# Patient Record
Sex: Male | Born: 1980 | Race: White | Hispanic: No | Marital: Single | Smoking: Current every day smoker
Health system: Southern US, Community
[De-identification: ages and names within clinical notes are randomized; demographics above are authoritative.]

## PROBLEM LIST (undated history)

## (undated) DIAGNOSIS — K029 Dental caries, unspecified: Secondary | ICD-10-CM

## (undated) DIAGNOSIS — N200 Calculus of kidney: Secondary | ICD-10-CM

---

## 2005-04-10 ENCOUNTER — Emergency Department (HOSPITAL_COMMUNITY): Admission: EM | Admit: 2005-04-10 | Discharge: 2005-04-10 | Payer: Self-pay | Admitting: Emergency Medicine

## 2005-04-24 ENCOUNTER — Emergency Department (HOSPITAL_COMMUNITY): Admission: EM | Admit: 2005-04-24 | Discharge: 2005-04-24 | Payer: Self-pay | Admitting: Emergency Medicine

## 2005-05-03 ENCOUNTER — Emergency Department: Payer: Self-pay | Admitting: Emergency Medicine

## 2005-05-04 ENCOUNTER — Emergency Department (HOSPITAL_COMMUNITY): Admission: EM | Admit: 2005-05-04 | Discharge: 2005-05-05 | Payer: Self-pay | Admitting: Emergency Medicine

## 2005-12-24 ENCOUNTER — Emergency Department (HOSPITAL_COMMUNITY): Admission: EM | Admit: 2005-12-24 | Discharge: 2005-12-24 | Payer: Self-pay | Admitting: Emergency Medicine

## 2006-06-29 ENCOUNTER — Emergency Department (HOSPITAL_COMMUNITY): Admission: EM | Admit: 2006-06-29 | Discharge: 2006-06-29 | Payer: Self-pay | Admitting: Emergency Medicine

## 2009-12-22 ENCOUNTER — Emergency Department (HOSPITAL_COMMUNITY): Admission: EM | Admit: 2009-12-22 | Discharge: 2009-12-22 | Payer: Self-pay | Admitting: Emergency Medicine

## 2010-02-07 ENCOUNTER — Emergency Department (HOSPITAL_COMMUNITY): Admission: EM | Admit: 2010-02-07 | Discharge: 2010-02-08 | Payer: Self-pay | Admitting: Emergency Medicine

## 2010-02-18 ENCOUNTER — Emergency Department (HOSPITAL_COMMUNITY): Admission: EM | Admit: 2010-02-18 | Discharge: 2010-02-18 | Payer: Self-pay | Admitting: Emergency Medicine

## 2010-10-29 LAB — RAPID URINE DRUG SCREEN, HOSP PERFORMED
Amphetamines: NOT DETECTED
Barbiturates: NOT DETECTED
Benzodiazepines: POSITIVE — AB
Cocaine: POSITIVE — AB
Opiates: NOT DETECTED
Tetrahydrocannabinol: POSITIVE — AB

## 2010-10-29 LAB — CBC
HCT: 44.3 % (ref 39.0–52.0)
Hemoglobin: 15.4 g/dL (ref 13.0–17.0)
MCH: 33.4 pg (ref 26.0–34.0)
MCHC: 34.7 g/dL (ref 30.0–36.0)
MCV: 96.1 fL (ref 78.0–100.0)
Platelets: 163 10*3/uL (ref 150–400)
RBC: 4.61 MIL/uL (ref 4.22–5.81)
RDW: 13.1 % (ref 11.5–15.5)
WBC: 5.4 10*3/uL (ref 4.0–10.5)

## 2010-10-29 LAB — BASIC METABOLIC PANEL
BUN: 8 mg/dL (ref 6–23)
CO2: 26 mEq/L (ref 19–32)
Calcium: 9 mg/dL (ref 8.4–10.5)
Chloride: 103 mEq/L (ref 96–112)
Creatinine, Ser: 0.79 mg/dL (ref 0.4–1.5)
GFR calc Af Amer: >60 mL/min (ref >60)
GFR calc non Af Amer: >60 mL/min (ref >60)
Glucose, Bld: 74 mg/dL (ref 70–99)
Potassium: 3.8 mEq/L (ref 3.5–5.1)
Sodium: 140 mEq/L (ref 135–145)

## 2010-10-29 LAB — URINALYSIS, ROUTINE W REFLEX MICROSCOPIC
Bilirubin Urine: NEGATIVE
Glucose, UA: NEGATIVE mg/dL
Ketones, ur: NEGATIVE mg/dL
Leukocytes, UA: NEGATIVE
Nitrite: NEGATIVE
Protein, ur: NEGATIVE mg/dL
Specific Gravity, Urine: <1.005 — ABNORMAL LOW (ref 1.005–1.030)
Urobilinogen, UA: 0.2 mg/dL (ref 0.0–1.0)
pH: 6 (ref 5.0–8.0)

## 2010-10-29 LAB — SALICYLATE LEVEL: Salicylate Lvl: <4 mg/dL (ref 2.8–20.0)

## 2010-10-29 LAB — DIFFERENTIAL
Basophils Absolute: 0 10*3/uL (ref 0.0–0.1)
Basophils Relative: 1 % (ref 0–1)
Eosinophils Absolute: 0.1 10*3/uL (ref 0.0–0.7)
Eosinophils Relative: 1 % (ref 0–5)
Lymphocytes Relative: 42 % (ref 12–46)
Lymphs Abs: 2.3 10*3/uL (ref 0.7–4.0)
Monocytes Absolute: 0.4 10*3/uL (ref 0.1–1.0)
Monocytes Relative: 7 % (ref 3–12)
Neutro Abs: 2.6 10*3/uL (ref 1.7–7.7)
Neutrophils Relative %: 49 % (ref 43–77)

## 2010-10-29 LAB — URINE MICROSCOPIC-ADD ON

## 2010-10-29 LAB — CK: Total CK: 149 U/L (ref 7–232)

## 2010-10-29 LAB — ACETAMINOPHEN LEVEL: Acetaminophen (Tylenol), Serum: <10 ug/mL — ABNORMAL LOW (ref 10–30)

## 2010-10-29 LAB — ETHANOL: Alcohol, Ethyl (B): 186 mg/dL — ABNORMAL HIGH (ref 0–10)

## 2011-03-24 ENCOUNTER — Emergency Department (HOSPITAL_COMMUNITY)
Admission: EM | Admit: 2011-03-24 | Discharge: 2011-03-24 | Disposition: A | Discharge status: Home / Self Care | Payer: Self-pay | Attending: Emergency Medicine | Admitting: Emergency Medicine

## 2011-03-24 DIAGNOSIS — K0510 Chronic gingivitis, plaque induced: Secondary | ICD-10-CM

## 2011-03-24 DIAGNOSIS — K0889 Other specified disorders of teeth and supporting structures: Secondary | ICD-10-CM

## 2011-03-24 DIAGNOSIS — K089 Disorder of teeth and supporting structures, unspecified: Secondary | ICD-10-CM | POA: Insufficient documentation

## 2011-03-24 HISTORY — DX: Calculus of kidney: N20.0

## 2011-03-24 MED ORDER — HYDROCODONE-ACETAMINOPHEN 5-325 MG PO TABS: ORAL_TABLET | ORAL | Status: AC

## 2011-03-24 MED ORDER — PENICILLIN V POTASSIUM 500 MG PO TABS: 500.0000 mg | ORAL_TABLET | Freq: Four times a day (QID) | ORAL | Status: AC

## 2011-03-24 NOTE — ED Notes (Signed)
C/o dental pain for 3 days. Pt states he has Dentist appt for Thursday and pt unable to wait till then d/t pain for back tooth and having partial placed to front teeth.

## 2011-03-24 NOTE — ED Notes (Signed)
Dental pain for several days. Unable to see Dentist till next Thursday.

## 2011-03-24 NOTE — ED Provider Notes (Signed)
History     CSN: [id number] Arrival date & time: 03/24/2011  1:30 PM  Chief Complaint  Patient presents with  . Dental Pain   Patient is a 30 y.o. male presenting with tooth pain. The history is provided by the patient.  Dental PainThe primary symptoms include mouth pain. Primary symptoms do not include oral lesions, headaches, fever, sore throat, angioedema or cough. The symptoms began 3 to 5 days ago. The symptoms are worsening. The symptoms are recurrent. The symptoms occur constantly.  Mouth pain began 3 - 5 days ago. Mouth pain occurs constantly. Affected locations include: teeth and gum(s). At its highest the mouth pain was at 10/10. The mouth pain is currently at 10/10.  Additional symptoms include: dental sensitivity to temperature, gum swelling and gum tenderness. Additional symptoms do not include: purulent gums, trismus, jaw pain, facial swelling, trouble swallowing, pain with swallowing, taste disturbance, drooling and swollen glands. Medical issues include: periodontal disease.    Past Medical History  Diagnosis Date  . Kidney stones     History reviewed. No pertinent past surgical history.  No family history on file.  History  Substance Use Topics  . Smoking status: Current Everyday Smoker  . Smokeless tobacco: Not on file  . Alcohol Use: No      Review of Systems  Constitutional: Negative for fever.  HENT: Positive for dental problem. Negative for sore throat, facial swelling, drooling, mouth sores and trouble swallowing.   Respiratory: Negative for cough.   Neurological: Negative for headaches.  All other systems reviewed and are negative.    Physical Exam  BP 118/69  Pulse 98  Temp(Src) 98.1 F (36.7 C) (Oral)  Resp 20  Ht 6' (1.829 m)  Wt 145 lb (65.772 kg)  BMI 19.67 kg/m2  SpO2 98%  Physical Exam  Nursing note and vitals reviewed. Constitutional: He is oriented to person, place, and time. He appears well-developed and well-nourished. No  distress.  HENT:  Head: Normocephalic and atraumatic. No trismus in the jaw.  Right Ear: External ear normal.  Left Ear: External ear normal.  Mouth/Throat: Uvula is midline, oropharynx is clear and moist and mucous membranes are normal. No oral lesions. Dental caries present. No dental abscesses or uvula swelling.    Eyes: Pupils are equal, round, and reactive to light.  Neck: Normal range of motion. Neck supple. No thyromegaly present.  Cardiovascular: Normal rate, regular rhythm and normal heart sounds.   Pulmonary/Chest: Effort normal and breath sounds normal.  Musculoskeletal: He exhibits no edema and no tenderness.  Lymphadenopathy:    He has no cervical adenopathy.  Neurological: He is alert and oriented to person, place, and time. Coordination normal.  Skin: Skin is warm and dry.    ED Course  Procedures  MDM   Multiple dental caries.  Erythema and mild edema of the gums surrounding the left lower first and second  molars.  No trismus, airway is clear      Amal Saiki L. [place], [place] 03/30/11 2034

## 2011-03-26 NOTE — ED Provider Notes (Signed)
History     CSN: [id number] Arrival date & time: 03/24/2011  1:30 PM  Chief Complaint  Patient presents with  . Dental Pain   HPI  Past Medical History  Diagnosis Date  . Kidney stones     History reviewed. No pertinent past surgical history.  No family history on file.  History  Substance Use Topics  . Smoking status: Current Everyday Smoker  . Smokeless tobacco: Not on file  . Alcohol Use: No      Review of Systems  Physical Exam  BP 118/69  Pulse 98  Temp(Src) 98.1 F (36.7 C) (Oral)  Resp 20  Ht 6' (1.829 m)  Wt 145 lb (65.772 kg)  BMI 19.67 kg/m2  SpO2 98%  Physical Exam  ED Course  Procedures  MDM Medical screening examination/treatment/procedure(s) were performed by non-physician practitioner and as supervising physician I was immediately available for consultation/collaboration.      Donnetta Hutching, MD 03/26/11 [phone]

## 2011-09-03 ENCOUNTER — Emergency Department (HOSPITAL_COMMUNITY)
Admission: EM | Admit: 2011-09-03 | Discharge: 2011-09-04 | Disposition: A | Discharge status: Home / Self Care | Payer: Self-pay | Attending: Emergency Medicine | Admitting: Emergency Medicine

## 2011-09-03 ENCOUNTER — Encounter (HOSPITAL_COMMUNITY): Payer: Self-pay | Admitting: *Deleted

## 2011-09-03 ENCOUNTER — Emergency Department (HOSPITAL_COMMUNITY): Payer: Self-pay

## 2011-09-03 DIAGNOSIS — R112 Nausea with vomiting, unspecified: Secondary | ICD-10-CM | POA: Insufficient documentation

## 2011-09-03 DIAGNOSIS — R1031 Right lower quadrant pain: Secondary | ICD-10-CM | POA: Insufficient documentation

## 2011-09-03 DIAGNOSIS — F17200 Nicotine dependence, unspecified, uncomplicated: Secondary | ICD-10-CM | POA: Insufficient documentation

## 2011-09-03 DIAGNOSIS — Y9269 Other specified industrial and construction area as the place of occurrence of the external cause: Secondary | ICD-10-CM | POA: Insufficient documentation

## 2011-09-03 DIAGNOSIS — R21 Rash and other nonspecific skin eruption: Secondary | ICD-10-CM | POA: Insufficient documentation

## 2011-09-03 DIAGNOSIS — R10813 Right lower quadrant abdominal tenderness: Secondary | ICD-10-CM | POA: Insufficient documentation

## 2011-09-03 DIAGNOSIS — R197 Diarrhea, unspecified: Secondary | ICD-10-CM | POA: Insufficient documentation

## 2011-09-03 DIAGNOSIS — X500XXA Overexertion from strenuous movement or load, initial encounter: Secondary | ICD-10-CM | POA: Insufficient documentation

## 2011-09-03 DIAGNOSIS — Z87442 Personal history of urinary calculi: Secondary | ICD-10-CM | POA: Insufficient documentation

## 2011-09-03 LAB — URINE MICROSCOPIC-ADD ON

## 2011-09-03 LAB — BASIC METABOLIC PANEL
BUN: 11 mg/dL (ref 6–23)
CO2: 25 mEq/L (ref 19–32)
Calcium: 10.2 mg/dL (ref 8.4–10.5)
Chloride: 102 mEq/L (ref 96–112)
Creatinine, Ser: 0.78 mg/dL (ref 0.50–1.35)
GFR calc Af Amer: >90 mL/min (ref >90)
GFR calc non Af Amer: >90 mL/min (ref >90)
Glucose, Bld: 93 mg/dL (ref 70–99)
Potassium: 3.8 mEq/L (ref 3.5–5.1)
Sodium: 139 mEq/L (ref 135–145)

## 2011-09-03 LAB — URINALYSIS, ROUTINE W REFLEX MICROSCOPIC
Bilirubin Urine: NEGATIVE
Glucose, UA: NEGATIVE mg/dL
Ketones, ur: NEGATIVE mg/dL
Leukocytes, UA: NEGATIVE
Nitrite: NEGATIVE
Protein, ur: NEGATIVE mg/dL
Specific Gravity, Urine: <1.005 — ABNORMAL LOW (ref 1.005–1.030)
Urobilinogen, UA: 0.2 mg/dL (ref 0.0–1.0)
pH: 5.5 (ref 5.0–8.0)

## 2011-09-03 LAB — DIFFERENTIAL
Basophils Absolute: 0 10*3/uL (ref 0.0–0.1)
Basophils Relative: 1 % (ref 0–1)
Eosinophils Absolute: 0.1 10*3/uL (ref 0.0–0.7)
Eosinophils Relative: 2 % (ref 0–5)
Lymphocytes Relative: 38 % (ref 12–46)
Lymphs Abs: 2.3 10*3/uL (ref 0.7–4.0)
Monocytes Absolute: 0.6 10*3/uL (ref 0.1–1.0)
Monocytes Relative: 9 % (ref 3–12)
Neutro Abs: 3 10*3/uL (ref 1.7–7.7)
Neutrophils Relative %: 50 % (ref 43–77)

## 2011-09-03 LAB — CBC
HCT: 44.2 % (ref 39.0–52.0)
Hemoglobin: 15.5 g/dL (ref 13.0–17.0)
MCH: 31.6 pg (ref 26.0–34.0)
MCHC: 35.1 g/dL (ref 30.0–36.0)
MCV: 90.2 fL (ref 78.0–100.0)
Platelets: 188 10*3/uL (ref 150–400)
RBC: 4.9 MIL/uL (ref 4.22–5.81)
RDW: 13 % (ref 11.5–15.5)
WBC: 5.9 10*3/uL (ref 4.0–10.5)

## 2011-09-03 MED ORDER — SODIUM CHLORIDE 0.9 % IV BOLUS (SEPSIS)
500.0000 mL | Freq: Once | INTRAVENOUS | Status: AC
  Administered 2011-09-03: 500 mL via INTRAVENOUS

## 2011-09-03 MED ORDER — SODIUM CHLORIDE 0.9 % IV SOLN
INTRAVENOUS | Status: DC
  Administered 2011-09-03: via INTRAVENOUS

## 2011-09-03 MED ORDER — MORPHINE SULFATE 4 MG/ML IJ SOLN
4.0000 mg | Freq: Once | INTRAMUSCULAR | Status: AC
  Administered 2011-09-03: 4 mg via INTRAVENOUS
  Filled 2011-09-03: qty 1

## 2011-09-03 MED ORDER — ONDANSETRON HCL 4 MG/2ML IJ SOLN
4.0000 mg | Freq: Once | INTRAMUSCULAR | Status: AC
  Administered 2011-09-03: 4 mg via INTRAVENOUS
  Filled 2011-09-03: qty 2

## 2011-09-03 NOTE — ED Provider Notes (Addendum)
History  This chart was scribed for [employer]. Rubin Payor, MD by Bennett Scrape. This patient was seen in room APA16A/[facility]16A and the patient's care was started at 10:09PM.  CSN: [id number]  Arrival date & time 09/03/11  2103   First MD Initiated Contact with Patient 09/03/11 2203      Chief Complaint  Patient presents with  . Abdominal Pain    The history is provided by the patient. No language interpreter was used.    Samuel Powell is a 31 y.o. male who presents to the Emergency Department complaining of 7 hours of sudden onset, gradually worsening lower abdominal pain with radiation into the right inguinal region and right testicle. 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 He has not taken any medications PTA to improve symptoms. The symptoms are worsened with movement and improved with rest. Pt also c/o nausea, one episode of vomiting described as bile and two episodes of diarrhea. He reports that he was able to eat a sandwhich tonight without vomiting. He denies fevers as an associated symptom. He has a h/o kidney stones but states that this pain is different from the kidney stone pain in location and quality. He denies a h/o hernias. He is a current smoker and denies alcohol use.  Past Medical History  Diagnosis Date  . Kidney stones     History reviewed. No pertinent past surgical history.  History reviewed. No pertinent family history.  History  Substance Use Topics  . Smoking status: Current Everyday Smoker  . Smokeless tobacco: Not on file  . Alcohol Use: No      Review of Systems  Constitutional: Negative for fever and chills.  HENT: Negative for sore throat, rhinorrhea and neck pain.   Eyes: Negative for pain.  Respiratory: Negative for cough and shortness of breath.   Gastrointestinal: Positive for nausea, vomiting and diarrhea.  Genitourinary: Negative for dysuria and hematuria.  Musculoskeletal: Negative for back pain.    Skin: Positive for rash.  Neurological: Negative for weakness and headaches.    Allergies  Review of patient's allergies indicates no known allergies.  Home Medications   Current Outpatient Rx  Name Route Sig Dispense Refill  . IBUPROFEN 200 MG PO TABS Oral Take 200-400 mg by mouth every 6 (six) hours as needed. As needed for pain or migraines       Triage Vitals: BP 148/80  Pulse 103  Temp(Src) 97.9 F (36.6 C) (Oral)  Resp 20  Ht 6' (1.829 m)  Wt 147 lb (66.679 kg)  BMI 19.94 kg/m2  SpO2 98%  Physical Exam  Nursing note and vitals reviewed. Constitutional: He is oriented to person, place, and time. He appears well-developed and well-nourished.  HENT:  Head: Normocephalic and atraumatic.       Poor dentiton  Eyes: Conjunctivae and EOM are normal.  Neck: Normal range of motion. Neck supple.  Cardiovascular: Normal rate and regular rhythm.   No murmur heard. Pulmonary/Chest: Effort normal and breath sounds normal. No respiratory distress.  Abdominal: Soft. He exhibits no mass. There is tenderness (Right lower abdominal pain to right ingunial region).  Genitourinary:       Right inguinal canal tenderness, no testicular tenderness or mass noted  Musculoskeletal: Normal range of motion. He exhibits no edema.  Neurological: He is alert and oriented to person, place, and time. No cranial nerve deficit.  Skin: Skin is warm and dry. Rash (Chronic macular rash) noted.  Psychiatric: He  has a normal mood and affect. His behavior is normal.    ED Course  Procedures (including critical care time)  DIAGNOSTIC STUDIES: Oxygen Saturation is 98% on room air, normal by my interpretation.    COORDINATION OF CARE: 10:12PM-Discussed Ct scan, urinalysis and blood panel with pt and pt agreed. Will give pain and antinausea medications.    Labs Reviewed - No data to display No results found.   No diagnosis found.    MDM  Right lower abdomen to right groin pain that began  acutely today. He's also had some nausea vomiting diarrhea. He has had a decreased appetite. No change with eating. No testicular tenderness. No inguinal mass, he does have inguinal tenderness. CT is pending at this time. Care will be turned over to Dr. Preston Fleeting  I personally performed the services described in this documentation, which was scribed in my presence. The recorded information has been reviewed and considered.        Juliet Rude. Rubin Payor, MD 09/03/11 2327  CT scan has come back unremarkable. Lab work is unremarkable. Patient is examined he has only mild tenderness in the right lower quadrant, nothing worrisome for appendicitis. He'll be sent home with a prescription for Percocet.  Results for orders placed during the hospital encounter of 09/03/11  CBC      Component Value Range   WBC 5.9  4.0 - 10.5 (K/uL)   RBC 4.90  4.22 - 5.81 (MIL/uL)   Hemoglobin 15.5  13.0 - 17.0 (g/dL)   HCT 16.1  09.6 - 04.5 (%)   MCV 90.2  78.0 - 100.0 (fL)   MCH 31.6  26.0 - 34.0 (pg)   MCHC 35.1  30.0 - 36.0 (g/dL)   RDW 40.9  81.1 - 91.4 (%)   Platelets 188  150 - 400 (K/uL)  DIFFERENTIAL      Component Value Range   Neutrophils Relative 50  43 - 77 (%)   Neutro Abs 3.0  1.7 - 7.7 (K/uL)   Lymphocytes Relative 38  12 - 46 (%)   Lymphs Abs 2.3  0.7 - 4.0 (K/uL)   Monocytes Relative 9  3 - 12 (%)   Monocytes Absolute 0.6  0.1 - 1.0 (K/uL)   Eosinophils Relative 2  0 - 5 (%)   Eosinophils Absolute 0.1  0.0 - 0.7 (K/uL)   Basophils Relative 1  0 - 1 (%)   Basophils Absolute 0.0  0.0 - 0.1 (K/uL)  BASIC METABOLIC PANEL      Component Value Range   Sodium 139  135 - 145 (mEq/L)   Potassium 3.8  3.5 - 5.1 (mEq/L)   Chloride 102  96 - 112 (mEq/L)   CO2 25  19 - 32 (mEq/L)   Glucose, Bld 93  70 - 99 (mg/dL)   BUN 11  6 - 23 (mg/dL)   Creatinine, Ser 7.82  0.50 - 1.35 (mg/dL)   Calcium 95.6  8.4 - 10.5 (mg/dL)   GFR calc non Af Amer >90  >90 (mL/min)   GFR calc Af Amer >90  >90 (mL/min)    URINALYSIS, ROUTINE W REFLEX MICROSCOPIC      Component Value Range   Color, Urine STRAW (*) YELLOW    APPearance CLEAR  CLEAR    Specific Gravity, Urine <1.005 (*) 1.005 - 1.030    pH 5.5  5.0 - 8.0    Glucose, UA NEGATIVE  NEGATIVE (mg/dL)   Hgb urine dipstick SMALL (*) NEGATIVE    Bilirubin  Urine NEGATIVE  NEGATIVE    Ketones, ur NEGATIVE  NEGATIVE (mg/dL)   Protein, ur NEGATIVE  NEGATIVE (mg/dL)   Urobilinogen, UA 0.2  0.0 - 1.0 (mg/dL)   Nitrite NEGATIVE  NEGATIVE    Leukocytes, UA NEGATIVE  NEGATIVE   URINE MICROSCOPIC-ADD ON      Component Value Range   Squamous Epithelial / LPF RARE  RARE    WBC, UA 0-2  <3 (WBC/hpf)   RBC / HPF 0-2  <3 (RBC/hpf)   Bacteria, UA RARE  RARE    Ct Abdomen Pelvis W Contrast  09/04/2011  *RADIOLOGY REPORT*  Clinical Data: Right lower quadrant pain radiating into the right testicle since a lifting injury today.  CT ABDOMEN AND PELVIS WITH CONTRAST  Technique:  Multidetector CT imaging of the abdomen and pelvis was performed following the standard protocol during bolus administration of intravenous contrast.  Contrast: 40mL OMNIPAQUE IOHEXOL 300 MG/ML IV SOLN, OMNIPAQUE IOHEXOL 300 MG/ML IV SOLN  Comparison: None.  Findings: The lung bases demonstrate some mild atelectasis.  No pleural or pericardial effusion.  The gallbladder, liver, spleen, pancreas and adrenal glands appear normal.  The patient has three to four punctate nonobstructing stones in each kidney.  No ureteral stones or hydronephrosis. The renal collecting systems appear full bilaterally likely due to urinary bladder distention.  Prostate gland and seminal vesicles appear normal.  The stomach, small and large bowel and appendix appear normal.  No focal bony abnormality.  IMPRESSION: Bilateral punctate nonobstructing renal stones are present.  There is no hydronephrosis or ureteral stone.  No acute finding.  Original Report Authenticated By: Bernadene Bell. Maricela Curet, M.D.      Dione Booze,  MD 09/04/11 [phone]

## 2011-09-03 NOTE — ED Notes (Signed)
Nausea/vomiting., diarrhea x2

## 2011-09-04 MED ORDER — OXYCODONE-ACETAMINOPHEN 5-325 MG PO TABS: 1.0000 | ORAL_TABLET | ORAL | Status: AC | PRN

## 2011-09-04 MED ORDER — IOHEXOL 300 MG/ML  SOLN
40.0000 mL | Freq: Once | INTRAMUSCULAR | Status: AC | PRN
  Administered 2011-09-04: 40 mL via ORAL

## 2011-09-04 MED ORDER — IOHEXOL 300 MG/ML  SOLN
100.0000 mL | Freq: Once | INTRAMUSCULAR | Status: AC | PRN
  Administered 2011-09-04: 100 mL via INTRAVENOUS

## 2013-10-11 ENCOUNTER — Emergency Department (HOSPITAL_COMMUNITY)
Admission: EM | Admit: 2013-10-11 | Discharge: 2013-10-11 | Disposition: A | Discharge status: Home / Self Care | Payer: Self-pay | Attending: Emergency Medicine | Admitting: Emergency Medicine

## 2013-10-11 ENCOUNTER — Emergency Department (HOSPITAL_COMMUNITY): Payer: Self-pay

## 2013-10-11 ENCOUNTER — Encounter (HOSPITAL_COMMUNITY): Payer: Self-pay | Admitting: Emergency Medicine

## 2013-10-11 DIAGNOSIS — F17200 Nicotine dependence, unspecified, uncomplicated: Secondary | ICD-10-CM | POA: Insufficient documentation

## 2013-10-11 DIAGNOSIS — S86819A Strain of other muscle(s) and tendon(s) at lower leg level, unspecified leg, initial encounter: Principal | ICD-10-CM

## 2013-10-11 DIAGNOSIS — S838X9A Sprain of other specified parts of unspecified knee, initial encounter: Secondary | ICD-10-CM | POA: Insufficient documentation

## 2013-10-11 DIAGNOSIS — Z87442 Personal history of urinary calculi: Secondary | ICD-10-CM | POA: Insufficient documentation

## 2013-10-11 DIAGNOSIS — S76312A Strain of muscle, fascia and tendon of the posterior muscle group at thigh level, left thigh, initial encounter: Secondary | ICD-10-CM

## 2013-10-11 DIAGNOSIS — Y990 Civilian activity done for income or pay: Secondary | ICD-10-CM | POA: Insufficient documentation

## 2013-10-11 DIAGNOSIS — Y9289 Other specified places as the place of occurrence of the external cause: Secondary | ICD-10-CM | POA: Insufficient documentation

## 2013-10-11 DIAGNOSIS — Y9389 Activity, other specified: Secondary | ICD-10-CM | POA: Insufficient documentation

## 2013-10-11 DIAGNOSIS — X500XXA Overexertion from strenuous movement or load, initial encounter: Secondary | ICD-10-CM | POA: Insufficient documentation

## 2013-10-11 MED ORDER — IBUPROFEN 600 MG PO TABS: 600.0000 mg | ORAL_TABLET | Freq: Four times a day (QID) | ORAL | Status: DC | PRN

## 2013-10-11 MED ORDER — HYDROCODONE-ACETAMINOPHEN 5-325 MG PO TABS: 1.0000 | ORAL_TABLET | ORAL | Status: DC | PRN

## 2013-10-11 NOTE — Discharge Instructions (Signed)
Hamstring Strain  Hamstrings are the large muscles in the back of the thighs. A strain or tear injury happens when there is a sudden stretch or pull on these muscles and tendons. Tendons are cord like structures that attach muscle to bone. These injuries are commonly seen in activities such as sprinting due to sudden acceleration.  DIAGNOSIS  Often the diagnosis can be made by examination. HOME CARE INSTRUCTIONS   Apply ice to the sore area for 15-7320minutes, 03-04 times per day. Do this while awake for the first 2 days. Put the ice in a plastic bag, and place a towel between the bag of ice and your skin.  Keep your knee flexed when possible. This means your foot is held off the ground slightly if you are on crutches. When lying down, a pillow under the knee will take strain off the muscles and provide some relief.  If a compression bandage such as an ace wrap was applied, use it until you are seen again. You may remove it for sleeping, showers and baths. If the wrap seems to be too tight and is uncomfortable, wrap it more loosely. If your toes or foot are getting cold or blue, it is too tight.  Walk or move around as the pain allows, or as instructed. Resume full activities as suggested by your caregiver. This is often safest when the strength of the injured leg has nearly returned to normal.  Only take over-the-counter or prescription medicines for pain, discomfort, or fever as directed by your caregiver. SEEK MEDICAL CARE IF:   You have an increase in bruising, swelling or pain.  You notice coldness or blueness of your toes or foot.  Pain relief is not obtained with medications.  You have increasing pain in the area and seem to be getting worse rather than better.  You notice your thigh getting larger in size (this could indicate bleeding into the muscle). Document Released: 04/24/2001 Document Revised: 10/22/2011 Document Reviewed: 08/01/2008 [facility] Patient Information 2014  [place], [place].   Do not drive within 4 hours of taking the narcotic pain medication as this will make you drowsy.  Follow the instructions above.  Use your crutches for comfort and to minimize weightbearing on your left leg.  Call Dr. Romeo AppleHarrison for further evaluation of your injury if your symptoms are not completely resolved over the next week.  Your x-rays today are normal

## 2013-10-11 NOTE — ED Provider Notes (Signed)
CSN: [id number]     Arrival date & time 10/11/13  1257 History   First MD Initiated Contact with Patient 10/11/13 1310     Chief Complaint  Patient presents with  . Leg Pain     (Consider location/radiation/quality/duration/timing/severity/associated sxs/prior Treatment) HPI Comments: Samuel Powell is a 33 y.o. Male presenting with left posterior knee and upper thigh pain which developed suddenly when standing from a squatting position yesterday.  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  He has decreased pain with knee and hip flexion and when lying flat on his abdomen, but worse pain with knee extension and weight bearing. He has taken tylenol and applied a heating pad and has rested the leg without significant improvement.  He denies numbness or weakness in his lower leg and foot.    The history is provided by the patient.    Past Medical History  Diagnosis Date  . Kidney stones    History reviewed. No pertinent past surgical history. History reviewed. No pertinent family history. History  Substance Use Topics  . Smoking status: Current Every Day Smoker    Types: Cigarettes  . Smokeless tobacco: Not on file  . Alcohol Use: No    Review of Systems  Constitutional: Negative for fever.  Musculoskeletal: Positive for arthralgias. Negative for joint swelling and myalgias.  Neurological: Negative for weakness and numbness.      Allergies  Review of patient's allergies indicates no known allergies.  Home Medications   Current Outpatient Rx  Name  Route  Sig  Dispense  Refill  . HYDROcodone-acetaminophen (NORCO/VICODIN) 5-325 MG per tablet   Oral   Take 1 tablet by mouth every 4 (four) hours as needed for moderate pain.   15 tablet    0   . ibuprofen (ADVIL,MOTRIN) 600 MG tablet   Oral   Take 1 tablet (600 mg total) by mouth every 6 (six) hours as needed.   30 tablet   0    BP 128/97  Pulse 97  Temp(Src) 97.9 F (36.6 C) (Oral)  Resp 16  Ht 6' (1.829 m)  Wt 150 lb (68.04 kg)  BMI 20.34 kg/m2  SpO2 100% Physical Exam  Constitutional: He appears well-developed and well-nourished.  HENT:  Head: Atraumatic.  Neck: Normal range of motion.  Cardiovascular:  Pulses equal bilaterally  Musculoskeletal: He exhibits tenderness.       Legs: Pain along left lateral hamstring.  No obvious visible or palpable deformity.  Knee is nontender,  No swelling or ecchymosis,  No knee ligament instability or pain with valgus or varus strain.  Neurological: He is alert. He has normal strength. He displays normal reflexes. No sensory deficit.  Skin: Skin is warm and dry.  Psychiatric: He has a normal mood and affect.    ED Course  Procedures (including critical care time) Labs Review Labs Reviewed - No data to display Imaging Review Dg Femur Left  10/11/2013   CLINICAL DATA:  Pain after lifting.  Left leg pain.  EXAM: LEFT FEMUR - 2 VIEW  COMPARISON:  None.  FINDINGS: There is no evidence of fracture or other focal bone lesions. Soft tissues are unremarkable.  IMPRESSION: Negative.   Electronically Signed   By: Amie Portland  M.D.   On: 10/11/2013 14:16     EKG Interpretation None      MDM   Final diagnoses:  Left hamstring muscle strain    Patients labs and/or radiological studies were viewed and considered during the medical decision making and disposition process. xrays negative for bony lesions,  No avulsion fx that would make ligament tear more likely.  Suspect simple hamstring strain.  Pt given ace wrap,  Crutches,  Ibuprofen, few hydrocodone.  Encouraged ice tx x 2 days,  Can add heat on day 3.  Referral to ortho if not improved over the next week.    Burgess AmorJulie Aleksia Freiman, PA-C 10/11/13 1659  Burgess AmorJulie Liandra Mendia,  PA-C 10/11/13 1700

## 2013-10-11 NOTE — ED Notes (Signed)
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

## 2013-10-13 NOTE — ED Provider Notes (Signed)
Medical screening examination/treatment/procedure(s) were performed by non-physician practitioner and as supervising physician I was immediately available for consultation/collaboration.   EKG Interpretation None        Charles B. Sheldon, MD 10/13/13 0714 

## 2014-02-04 ENCOUNTER — Emergency Department (HOSPITAL_COMMUNITY)
Admission: EM | Admit: 2014-02-04 | Discharge: 2014-02-04 | Disposition: A | Discharge status: Home / Self Care | Payer: Self-pay | Attending: Emergency Medicine | Admitting: Emergency Medicine

## 2014-02-04 ENCOUNTER — Encounter (HOSPITAL_COMMUNITY): Payer: Self-pay | Admitting: Emergency Medicine

## 2014-02-04 DIAGNOSIS — H16139 Photokeratitis, unspecified eye: Secondary | ICD-10-CM | POA: Insufficient documentation

## 2014-02-04 DIAGNOSIS — H16133 Photokeratitis, bilateral: Secondary | ICD-10-CM

## 2014-02-04 DIAGNOSIS — Z79899 Other long term (current) drug therapy: Secondary | ICD-10-CM | POA: Insufficient documentation

## 2014-02-04 DIAGNOSIS — F17200 Nicotine dependence, unspecified, uncomplicated: Secondary | ICD-10-CM | POA: Insufficient documentation

## 2014-02-04 DIAGNOSIS — Z87442 Personal history of urinary calculi: Secondary | ICD-10-CM | POA: Insufficient documentation

## 2014-02-04 MED ORDER — FLUORESCEIN SODIUM 1 MG OP STRP
ORAL_STRIP | OPHTHALMIC | Status: AC
  Filled 2014-02-04: qty 1

## 2014-02-04 MED ORDER — SULFACETAMIDE SODIUM 10 % OP SOLN: 1.0000 [drp] | OPHTHALMIC | Status: DC

## 2014-02-04 MED ORDER — OXYCODONE-ACETAMINOPHEN 5-325 MG PO TABS: 1.0000 | ORAL_TABLET | ORAL | Status: DC | PRN

## 2014-02-04 MED ORDER — OXYCODONE-ACETAMINOPHEN 5-325 MG PO TABS
1.0000 | ORAL_TABLET | Freq: Once | ORAL | Status: AC
  Administered 2014-02-04: 1 via ORAL
  Filled 2014-02-04: qty 1

## 2014-02-04 MED ORDER — FLUORESCEIN SODIUM 1 MG OP STRP: 1.0000 | ORAL_STRIP | Freq: Once | OPHTHALMIC | Status: DC

## 2014-02-04 NOTE — ED Provider Notes (Signed)
CSN: [id number]     Arrival date & time 02/04/14  0229 History   First MD Initiated Contact with Patient 02/04/14 0554     Chief Complaint  Patient presents with  . Eye Pain     (Consider location/radiation/quality/duration/timing/severity/associated sxs/prior Treatment) Patient is a 33 y.o. male presenting with eye pain. The history is provided by the patient.  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 Tonight, he started noticing pain in both thighs which got severe. His vision was poor he. He rated pain at 10/10. He states it feels like there is sand in his eyes. He is up-to-date on tetanus immunizations. He denies any other eye injury.  Past Medical History  Diagnosis Date  . Kidney stones    History reviewed. No pertinent past surgical history. History reviewed. No pertinent family history. History  Substance Use Topics  . Smoking status: Current Every Day Smoker    Types: Cigarettes  . Smokeless tobacco: Not on file  . Alcohol Use: No    Review of Systems  Eyes: Positive for pain.  All other systems reviewed and are negative.     Allergies  Review of patient's allergies indicates no known allergies.  Home Medications   Prior to Admission medications   Medication Sig Start Date End Date Taking? Authorizing Alyze Lauf  HYDROcodone-acetaminophen (NORCO/VICODIN) 5-325 MG per tablet Take 1 tablet by mouth every 4 (four) hours as needed for moderate pain. 10/11/13   Burgess AmorJulie Idol, PA-C  ibuprofen (ADVIL,MOTRIN) 600 MG tablet Take 1 tablet (600 mg total) by mouth every 6 (six) hours as needed. 10/11/13   Burgess AmorJulie Idol, PA-C   BP 111/71  Pulse 54  Temp(Src) 97.6 F (36.4 C) (Oral)  Resp 18  Ht 6' (1.829 m)  Wt 140 lb (63.504 kg)  BMI 18.98 kg/m2  SpO2 99% Physical Exam  Nursing note and vitals reviewed.  33 year old male, resting comfortably and in no acute distress. Vital signs are significant for bradycardia with heart rate 54. Oxygen saturation is  99%, which is normal. Head is normocephalic and atraumatic. PERRLA, EOMI. Oropharynx is clear. There is moderate conjunctival injection bilaterally. Anterior chamber is clear. Slit-lamp exam shows a punctate lesions in the cornea consistent with UV keratitis. Anterior chamber is clear without cells or ciliary flare. Iron stain with fluorescein and examined with cobalt blue filter which confirmed punctate areas of increased uptake consistent with UV keratitis. Neck is nontender and supple without adenopathy or JVD. Back is nontender and there is no CVA tenderness. Lungs are clear without rales, wheezes, or rhonchi. Chest is nontender. Heart has regular rate and rhythm without murmur. Abdomen is soft, flat, nontender without masses or hepatosplenomegaly and peristalsis is normoactive. Extremities have no cyanosis or edema, full range of motion is present. Skin is warm and dry without rash. Neurologic: Mental status is normal, cranial nerves are intact, there are no motor or sensory deficits.  ED Course  Procedures (including critical care time)  MDM   Final diagnoses:  UV keratitis, bilateral    UV keratitis secondary to welding. He is being discharged with prescription for sulfacetamide ophthalmic solution and oxycodone-acetaminophen.    Dione Boozeavid Glick, MD 02/04/14 [phone]

## 2014-02-04 NOTE — Discharge Instructions (Signed)
Ultraviolet Keratitis Ultraviolet Keratitis can occur when too much UV light enters the cornea. The cornea is the clear cover on the front part of your eye that helps focus light. It protects your eyes from dust and other foreign bodies and filters ultraviolet rays. This condition can be caused by exposure to snow (snow blindness) from the reflected or direct sunlight. It may also be caused by exposure to welding arcs or halogen lamps (flashburn) and prolonged exposure to direct sunlight. Brief exposure can result in severe problems several hours later. CAUSES  Causes of Ultraviolet Keratitis include:  Exposure to snow (snow blindness) from the reflected or direct sunlight.  Exposure to welding arcs or halogen lamps (flashburn).  Prolonged exposure to direct sunlight. SYMPTOMS  The symptoms of Ultraviolet Keratitis usually start 6 to 12 hours after the damage occurred. They may include the following:   Tearing.  Light sensitivity.  Gritty sensation in eyes.  Swelling of your eyelids.  Severe pain. In spite of the pain, this condition will usually improve within 24 hours even without treatment. HOME CARE INSTRUCTIONS   Apply cold packs on your eyes to ease the pain.  Only take over-the-counter or prescription medicines for pain, discomfort, or fever as directed by your caregiver.  Your caregiver may also prescribe an antibiotic ointment to help prevent infection and/or additional medications for pain relief.  Apply an eye patch to help relieve discomfort and assist in healing. If your caregiver patches your eyes, it is important to leave these patches on.  Follow the instructions given to you by your caregiver.  Do not rub your eyes.  If your caregiver has given you a follow-up appointment, it is very important to keep that appointment. Not keeping the appointment could result in a severe eye infection or permanent loss of vision. If there is any problem keeping the appointment,  you must call back to this facility for assistance. SEEK IMMEDIATE MEDICAL CARE IF:   Pain is severe and not relieved by medication.  Pain or problems with vision last more than 48 hours.  Exposure to light is unavoidable and you need extra protection for your eyes. MAKE SURE YOU:   Understand these instructions.  Will watch your condition.  Will get help right away if you are not doing well or get worse. Document Released: 07/30/2005 Document Revised: 10/22/2011 Document Reviewed: 03/05/2008 [facility] Patient Information 2015 [place], [place]. This information is not intended to replace advice given to you by your health care provider. Make sure you discuss any questions you have with your health care provider.  Sulfacetamide eye solution What is this medicine? SULFACETAMIDE (sul fa SEE ta mide) is a sulfonamide antibiotic. It is used to treat eye infections. This medicine may be used for other purposes; ask your health care provider or pharmacist if you have questions. COMMON BRAND NAME(S): Bleph-10, Ocu-Sul, Sodium Sulamyd, Sulf-10 What should I tell my health care provider before I take this medicine? They need to know if you have any of these conditions: -eye injury or eye surgery -an unusual or allergic reaction to sulfacetamide, sulfa drugs, other medicines, foods, dyes, or preservatives -pregnant or trying to get pregnant -breast-feeding How should I use this medicine? This medicine is only for use in the eye. Do not take by mouth. Follow the directions on the prescription label. Wash hands before and after use. Tilt your head back slightly and pull your lower eyelid down with your index finger to form a pouch. Try not to touch  the tip of the dropper to your eye, fingertips, or any other surface. Squeeze the prescribed number of drops into the pouch. Close the eye gently to spread the drops. Your vision may blur for a few minutes. Use your doses at regular intervals. Do not use  your medicine more often than directed. Finish the full course prescribed by your doctor or health care professional even if you think your condition is better. Talk to your pediatrician regarding the use of this medicine in children. Special care may be needed. Overdosage: If you think you have taken too much of this medicine contact a poison control center or emergency room at once. NOTE: This medicine is only for you. Do not share this medicine with others. What if I miss a dose? If you miss a dose, use it as soon as you can. If it is almost time for your next dose, use only that dose. Do not use double or extra doses. What may interact with this medicine? -eye products that contain silver This list may not describe all possible interactions. Give your health care provider a list of all the medicines, herbs, non-prescription drugs, or dietary supplements you use. Also tell them if you smoke, drink alcohol, or use illegal drugs. Some items may interact with your medicine. What should I watch for while using this medicine? Tell your doctor or health care professional if your symptoms do not get better in 2 to 3 days. A full course of treatment is usually 7 to 10 days. If you get any sign of an allergic reaction, stop using your eye product and call your doctor or health care professional. Wear sunglasses if this medicine makes your eyes more sensitive to light. Keep out of the sun, or wear protective clothing outdoors and use a sunscreen. Do not use sun lamps or sun tanning beds or booths. What side effects may I notice from receiving this medicine? Side effects that you should report to your doctor or health care professional as soon as possible: -blurred vision that does not go away -burning, blistering, peeling, stinging, or itching of the eyes or eyelids, skin or mouth -eye redness, swelling, or pain Side effects that usually do not require medical attention (report to your doctor or health  care professional if they continue or are bothersome): -blurred vision for a few moments after application This list may not describe all possible side effects. Call your doctor for medical advice about side effects. You may report side effects to FDA at 1-800-FDA-1088. Where should I keep my medicine? Keep out of the reach of children. Store between 2 and 30 degrees C (36 and 86 degrees F). Do not freeze. Throw away any unused eye products after the expiration date. NOTE: This sheet is a summary. It may not cover all possible information. If you have questions about this medicine, talk to your doctor, pharmacist, or health care provider.  2015, Elsevier/Gold Standard. (2008-04-02 13:04:42)  Acetaminophen; Oxycodone tablets What is this medicine? ACETAMINOPHEN; OXYCODONE (a set a MEE noe fen; ox i KOE done) is a pain reliever. It is used to treat mild to moderate pain. This medicine may be used for other purposes; ask your health care provider or pharmacist if you have questions. COMMON BRAND NAME(S): Endocet, Magnacet, Narvox, Percocet, Perloxx, Primalev, Primlev, Roxicet, Xolox What should I tell my health care provider before I take this medicine? They need to know if you have any of these conditions: -brain tumor -Crohn's disease, inflammatory bowel disease,  or ulcerative colitis -drug abuse or addiction -head injury -heart or circulation problems -if you often drink alcohol -kidney disease or problems going to the bathroom -liver disease -lung disease, asthma, or breathing problems -an unusual or allergic reaction to acetaminophen, oxycodone, other opioid analgesics, other medicines, foods, dyes, or preservatives -pregnant or trying to get pregnant -breast-feeding How should I use this medicine? Take this medicine by mouth with a full glass of water. Follow the directions on the prescription label. Take your medicine at regular intervals. Do not take your medicine more often than  directed. Talk to your pediatrician regarding the use of this medicine in children. Special care may be needed. Patients over 31 years old may have a stronger reaction and need a smaller dose. Overdosage: If you think you have taken too much of this medicine contact a poison control center or emergency room at once. NOTE: This medicine is only for you. Do not share this medicine with others. What if I miss a dose? If you miss a dose, take it as soon as you can. If it is almost time for your next dose, take only that dose. Do not take double or extra doses. What may interact with this medicine? -alcohol -antihistamines -barbiturates like amobarbital, butalbital, butabarbital, methohexital, pentobarbital, phenobarbital, thiopental, and secobarbital -benztropine -drugs for bladder problems like solifenacin, trospium, oxybutynin, tolterodine, hyoscyamine, and methscopolamine -drugs for breathing problems like ipratropium and tiotropium -drugs for certain stomach or intestine problems like propantheline, homatropine methylbromide, glycopyrrolate, atropine, belladonna, and dicyclomine -general anesthetics like etomidate, ketamine, nitrous oxide, propofol, desflurane, enflurane, halothane, isoflurane, and sevoflurane -medicines for depression, anxiety, or psychotic disturbances -medicines for sleep -muscle relaxants -naltrexone -narcotic medicines (opiates) for pain -phenothiazines like perphenazine, thioridazine, chlorpromazine, mesoridazine, fluphenazine, prochlorperazine, promazine, and trifluoperazine -scopolamine -tramadol -trihexyphenidyl This list may not describe all possible interactions. Give your health care provider a list of all the medicines, herbs, non-prescription drugs, or dietary supplements you use. Also tell them if you smoke, drink alcohol, or use illegal drugs. Some items may interact with your medicine. What should I watch for while using this medicine? Tell your doctor or  health care professional if your pain does not go away, if it gets worse, or if you have new or a different type of pain. You may develop tolerance to the medicine. Tolerance means that you will need a higher dose of the medication for pain relief. Tolerance is normal and is expected if you take this medicine for a long time. Do not suddenly stop taking your medicine because you may develop a severe reaction. Your body becomes used to the medicine. This does NOT mean you are addicted. Addiction is a behavior related to getting and using a drug for a non-medical reason. If you have pain, you have a medical reason to take pain medicine. Your doctor will tell you how much medicine to take. If your doctor wants you to stop the medicine, the dose will be slowly lowered over time to avoid any side effects. You may get drowsy or dizzy. Do not drive, use machinery, or do anything that needs mental alertness until you know how this medicine affects you. Do not stand or sit up quickly, especially if you are an older patient. This reduces the risk of dizzy or fainting spells. Alcohol may interfere with the effect of this medicine. Avoid alcoholic drinks. There are different types of narcotic medicines (opiates) for pain. If you take more than one type at the same time, you may  have more side effects. Give your health care provider a list of all medicines you use. Your doctor will tell you how much medicine to take. Do not take more medicine than directed. Call emergency for help if you have problems breathing. The medicine will cause constipation. Try to have a bowel movement at least every 2 to 3 days. If you do not have a bowel movement for 3 days, call your doctor or health care professional. Do not take Tylenol (acetaminophen) or medicines that have acetaminophen with this medicine. Too much acetaminophen can be very dangerous. Many nonprescription medicines contain acetaminophen. Always read the labels carefully to  avoid taking more acetaminophen. What side effects may I notice from receiving this medicine? Side effects that you should report to your doctor or health care professional as soon as possible: -allergic reactions like skin rash, itching or hives, swelling of the face, lips, or tongue -breathing difficulties, wheezing -confusion -light headedness or fainting spells -severe stomach pain -unusually weak or tired -yellowing of the skin or the whites of the eyes Side effects that usually do not require medical attention (report to your doctor or health care professional if they continue or are bothersome): -dizziness -drowsiness -nausea -vomiting This list may not describe all possible side effects. Call your doctor for medical advice about side effects. You may report side effects to FDA at 1-800-FDA-1088. Where should I keep my medicine? Keep out of the reach of children. This medicine can be abused. Keep your medicine in a safe place to protect it from theft. Do not share this medicine with anyone. Selling or giving away this medicine is dangerous and against the law. Store at room temperature between 20 and 25 degrees C (68 and 77 degrees F). Keep container tightly closed. Protect from light. This medicine may cause accidental overdose and death if it is taken by other adults, children, or pets. Flush any unused medicine down the toilet to reduce the chance of harm. Do not use the medicine after the expiration date. NOTE: This sheet is a summary. It may not cover all possible information. If you have questions about this medicine, talk to your doctor, pharmacist, or health care provider.  2015, Elsevier/Gold Standard. (2013-03-23 13:17:35)

## 2014-02-04 NOTE — ED Notes (Signed)
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

## 2015-01-10 ENCOUNTER — Encounter (HOSPITAL_COMMUNITY): Payer: Self-pay | Admitting: Emergency Medicine

## 2015-01-10 ENCOUNTER — Emergency Department (HOSPITAL_COMMUNITY)
Admission: EM | Admit: 2015-01-10 | Discharge: 2015-01-10 | Disposition: A | Discharge status: Home / Self Care | Payer: Self-pay | Attending: Emergency Medicine | Admitting: Emergency Medicine

## 2015-01-10 ENCOUNTER — Emergency Department (HOSPITAL_COMMUNITY): Payer: Self-pay

## 2015-01-10 DIAGNOSIS — R319 Hematuria, unspecified: Secondary | ICD-10-CM

## 2015-01-10 DIAGNOSIS — Z87442 Personal history of urinary calculi: Secondary | ICD-10-CM | POA: Insufficient documentation

## 2015-01-10 DIAGNOSIS — Z720 Tobacco use: Secondary | ICD-10-CM | POA: Insufficient documentation

## 2015-01-10 DIAGNOSIS — Z79899 Other long term (current) drug therapy: Secondary | ICD-10-CM | POA: Insufficient documentation

## 2015-01-10 DIAGNOSIS — R52 Pain, unspecified: Secondary | ICD-10-CM

## 2015-01-10 LAB — COMPREHENSIVE METABOLIC PANEL
ALT: 9 U/L — ABNORMAL LOW (ref 17–63)
AST: 15 U/L (ref 15–41)
Albumin: 4.5 g/dL (ref 3.5–5.0)
Alkaline Phosphatase: 68 U/L (ref 38–126)
Anion gap: 8 (ref 5–15)
BUN: 7 mg/dL (ref 6–20)
CO2: 30 mmol/L (ref 22–32)
Calcium: 8.9 mg/dL (ref 8.9–10.3)
Chloride: 101 mmol/L (ref 101–111)
Creatinine, Ser: 0.77 mg/dL (ref 0.61–1.24)
GFR calc Af Amer: >60 mL/min (ref >60)
GFR calc non Af Amer: >60 mL/min (ref >60)
Glucose, Bld: 90 mg/dL (ref 65–99)
Potassium: 3.7 mmol/L (ref 3.5–5.1)
Sodium: 139 mmol/L (ref 135–145)
Total Bilirubin: 0.7 mg/dL (ref 0.3–1.2)
Total Protein: 7.1 g/dL (ref 6.5–8.1)

## 2015-01-10 LAB — CBC WITH DIFFERENTIAL/PLATELET
Basophils Absolute: 0 10*3/uL (ref 0.0–0.1)
Basophils Relative: 0 % (ref 0–1)
Eosinophils Absolute: 0.1 10*3/uL (ref 0.0–0.7)
Eosinophils Relative: 1 % (ref 0–5)
HCT: 43.2 % (ref 39.0–52.0)
Hemoglobin: 14.8 g/dL (ref 13.0–17.0)
Lymphocytes Relative: 31 % (ref 12–46)
Lymphs Abs: 2.2 10*3/uL (ref 0.7–4.0)
MCH: 31.8 pg (ref 26.0–34.0)
MCHC: 34.3 g/dL (ref 30.0–36.0)
MCV: 92.7 fL (ref 78.0–100.0)
Monocytes Absolute: 0.4 10*3/uL (ref 0.1–1.0)
Monocytes Relative: 6 % (ref 3–12)
Neutro Abs: 4.4 10*3/uL (ref 1.7–7.7)
Neutrophils Relative %: 62 % (ref 43–77)
Platelets: 126 10*3/uL — ABNORMAL LOW (ref 150–400)
RBC: 4.66 MIL/uL (ref 4.22–5.81)
RDW: 12.7 % (ref 11.5–15.5)
WBC: 7.1 10*3/uL (ref 4.0–10.5)

## 2015-01-10 LAB — URINALYSIS, ROUTINE W REFLEX MICROSCOPIC
Bilirubin Urine: NEGATIVE
Glucose, UA: NEGATIVE mg/dL
Ketones, ur: NEGATIVE mg/dL
Leukocytes, UA: NEGATIVE
Nitrite: NEGATIVE
Protein, ur: NEGATIVE mg/dL
Specific Gravity, Urine: 1.025 (ref 1.005–1.030)
Urobilinogen, UA: 0.2 mg/dL (ref 0.0–1.0)
pH: 6 (ref 5.0–8.0)

## 2015-01-10 LAB — URINE MICROSCOPIC-ADD ON

## 2015-01-10 MED ORDER — OXYCODONE-ACETAMINOPHEN 5-325 MG PO TABS: 1.0000 | ORAL_TABLET | Freq: Four times a day (QID) | ORAL | Status: DC | PRN

## 2015-01-10 MED ORDER — HYDROMORPHONE HCL 1 MG/ML IJ SOLN
INTRAMUSCULAR | Status: AC
  Filled 2015-01-10: qty 1

## 2015-01-10 MED ORDER — OXYCODONE-ACETAMINOPHEN 5-325 MG PO TABS
ORAL_TABLET | ORAL | Status: AC
  Filled 2015-01-10: qty 1

## 2015-01-10 MED ORDER — ONDANSETRON HCL 4 MG/2ML IJ SOLN
4.0000 mg | Freq: Once | INTRAMUSCULAR | Status: AC
  Administered 2015-01-10: 4 mg via INTRAVENOUS
  Filled 2015-01-10: qty 2

## 2015-01-10 MED ORDER — HYDROMORPHONE HCL 1 MG/ML IJ SOLN
1.0000 mg | Freq: Once | INTRAMUSCULAR | Status: AC
  Administered 2015-01-10: 1 mg via INTRAVENOUS
  Filled 2015-01-10: qty 1

## 2015-01-10 MED ORDER — HYDROMORPHONE HCL 1 MG/ML IJ SOLN
1.0000 mg | Freq: Once | INTRAMUSCULAR | Status: AC
  Administered 2015-01-10: 1 mg via INTRAVENOUS

## 2015-01-10 MED ORDER — OXYCODONE-ACETAMINOPHEN 5-325 MG PO TABS
1.0000 | ORAL_TABLET | Freq: Once | ORAL | Status: AC
  Administered 2015-01-10: 1 via ORAL

## 2015-01-10 MED ORDER — SODIUM CHLORIDE 0.9 % IV BOLUS (SEPSIS)
1000.0000 mL | Freq: Once | INTRAVENOUS | Status: AC
  Administered 2015-01-10: 1000 mL via INTRAVENOUS

## 2015-01-10 NOTE — ED Notes (Signed)
Pt states that he has been having right flank pain for the past few days off and on.  States it feels the same as when he had kidney stones.

## 2015-01-10 NOTE — Discharge Instructions (Signed)
Follow up with [facility] this week. °

## 2015-01-10 NOTE — ED Notes (Signed)
Reminded patient urine specimen is still needed. Patient states that he is ok, pain has subsided greatly.

## 2015-01-13 NOTE — ED Provider Notes (Signed)
CSN: [id number]     Arrival date & time 01/10/15  1753 History   First MD Initiated Contact with Patient 01/10/15 1809     Chief Complaint  Patient presents with  . Flank Pain     (Consider location/radiation/quality/duration/timing/severity/associated sxs/prior Treatment) Patient is a 34 y.o. male presenting with flank pain. The history is provided by the patient (the pt complains of flank pain.  hx of kidney stones).  Flank Pain This is a recurrent problem. The current episode started 6 to 12 hours ago. The problem occurs constantly. The problem has not changed since onset.Pertinent negatives include no chest pain, no abdominal pain and no headaches. Nothing aggravates the symptoms. Nothing relieves the symptoms.    Past Medical History  Diagnosis Date  . Kidney stones    History reviewed. No pertinent past surgical history. History reviewed. No pertinent family history. History  Substance Use Topics  . Smoking status: Current Every Day Smoker    Types: Cigarettes  . Smokeless tobacco: Not on file  . Alcohol Use: No    Review of Systems  Constitutional: Negative for appetite change and fatigue.  HENT: Negative for congestion, ear discharge and sinus pressure.   Eyes: Negative for discharge.  Respiratory: Negative for cough.   Cardiovascular: Negative for chest pain.  Gastrointestinal: Negative for abdominal pain and diarrhea.  Genitourinary: Positive for flank pain. Negative for frequency and hematuria.  Musculoskeletal: Negative for back pain.  Skin: Negative for rash.  Neurological: Negative for seizures and headaches.  Psychiatric/Behavioral: Negative for hallucinations.      Allergies  Review of patient's allergies indicates no known allergies.  Home Medications   Prior to Admission medications   Medication Sig Start Date End Date Taking? Authorizing Provider  HYDROcodone-acetaminophen (NORCO/VICODIN) 5-325 MG per tablet Take 1 tablet by mouth every 4 (four)  hours as needed for moderate pain. Patient not taking: Reported on 01/10/2015 10/11/13   Burgess AmorJulie Idol, PA-C  ibuprofen (ADVIL,MOTRIN) 600 MG tablet Take 1 tablet (600 mg total) by mouth every 6 (six) hours as needed. Patient not taking: Reported on 01/10/2015 10/11/13   Burgess AmorJulie Idol, PA-C  oxyCODONE-acetaminophen (PERCOCET/ROXICET) 5-325 MG per tablet Take 1 tablet by mouth every 6 (six) hours as needed. 01/10/15   Bethann BerkshireJoseph Anwar Crill, MD  oxyCODONE-acetaminophen (PERCOCET/ROXICET) 5-325 MG per tablet Take 1 tablet by mouth every 6 (six) hours as needed. 01/10/15   Bethann BerkshireJoseph Aniylah Avans, MD  sulfacetamide (BLEPH-10) 10 % ophthalmic solution Place 1-2 drops into both eyes every 4 (four) hours. Patient not taking: Reported on 01/10/2015 02/04/14   Dione Boozeavid Glick, MD   BP 118/84 mmHg  Pulse 51  Temp(Src) 98.4 F (36.9 C) (Oral)  Resp 18  Ht 6' (1.829 m)  Wt 145 lb (65.772 kg)  BMI 19.66 kg/m2  SpO2 100% Physical Exam  Constitutional: He is oriented to person, place, and time. He appears well-developed.  HENT:  Head: Normocephalic.  Eyes: Conjunctivae and EOM are normal. No scleral icterus.  Neck: Neck supple. No thyromegaly present.  Cardiovascular: Normal rate and regular rhythm.  Exam reveals no gallop and no friction rub.   No murmur heard. Pulmonary/Chest: No stridor. He has no wheezes. He has no rales. He exhibits no tenderness.  Abdominal: He exhibits no distension. There is no tenderness. There is no rebound.  Genitourinary:  Tender right flank  Musculoskeletal: Normal range of motion. He exhibits no edema.  Lymphadenopathy:    He has no cervical adenopathy.  Neurological: He is oriented to person, place, and  time. He exhibits normal muscle tone. Coordination normal.  Skin: No rash noted. No erythema.  Psychiatric: He has a normal mood and affect. His behavior is normal.    ED Course  Procedures (including critical care time) Labs Review Labs Reviewed  CBC WITH DIFFERENTIAL/PLATELET - Abnormal;  Notable for the following:    Platelets 126 (*)    All other components within normal limits  COMPREHENSIVE METABOLIC PANEL - Abnormal; Notable for the following:    ALT 9 (*)    All other components within normal limits  URINALYSIS, ROUTINE W REFLEX MICROSCOPIC (NOT AT [facility]) - Abnormal; Notable for the following:    Hgb urine dipstick TRACE (*)    All other components within normal limits  URINE MICROSCOPIC-ADD ON - Abnormal; Notable for the following:    Bacteria, UA FEW (*)    All other components within normal limits    Imaging Review No results found.   EKG Interpretation None      MDM   Final diagnoses:  Pain  Hematuria    Pt referred to urology with pain meds    Bethann Berkshire, MD 01/13/15 1143

## 2015-01-26 MED FILL — Oxycodone w/ Acetaminophen Tab 5-325 MG: ORAL | Qty: 6 | Status: AC

## 2015-04-12 ENCOUNTER — Encounter (HOSPITAL_COMMUNITY): Payer: Self-pay | Admitting: Emergency Medicine

## 2015-04-12 ENCOUNTER — Emergency Department (HOSPITAL_COMMUNITY)
Admission: EM | Admit: 2015-04-12 | Discharge: 2015-04-12 | Disposition: A | Discharge status: Home / Self Care | Payer: Self-pay | Attending: Emergency Medicine | Admitting: Emergency Medicine

## 2015-04-12 DIAGNOSIS — Y998 Other external cause status: Secondary | ICD-10-CM | POA: Insufficient documentation

## 2015-04-12 DIAGNOSIS — Z792 Long term (current) use of antibiotics: Secondary | ICD-10-CM | POA: Insufficient documentation

## 2015-04-12 DIAGNOSIS — Y9389 Activity, other specified: Secondary | ICD-10-CM | POA: Insufficient documentation

## 2015-04-12 DIAGNOSIS — S70261A Insect bite (nonvenomous), right hip, initial encounter: Secondary | ICD-10-CM | POA: Insufficient documentation

## 2015-04-12 DIAGNOSIS — W57XXXA Bitten or stung by nonvenomous insect and other nonvenomous arthropods, initial encounter: Secondary | ICD-10-CM

## 2015-04-12 DIAGNOSIS — Z87442 Personal history of urinary calculi: Secondary | ICD-10-CM | POA: Insufficient documentation

## 2015-04-12 DIAGNOSIS — Z720 Tobacco use: Secondary | ICD-10-CM | POA: Insufficient documentation

## 2015-04-12 DIAGNOSIS — Y9289 Other specified places as the place of occurrence of the external cause: Secondary | ICD-10-CM | POA: Insufficient documentation

## 2015-04-12 DIAGNOSIS — M791 Myalgia, unspecified site: Secondary | ICD-10-CM

## 2015-04-12 DIAGNOSIS — K047 Periapical abscess without sinus: Secondary | ICD-10-CM

## 2015-04-12 MED ORDER — IBUPROFEN 800 MG PO TABS
800.0000 mg | ORAL_TABLET | Freq: Three times a day (TID) | ORAL | Status: DC
Start: 2015-04-12 — End: 2015-05-25

## 2015-04-12 MED ORDER — DOXYCYCLINE HYCLATE 100 MG PO TABS
100.0000 mg | ORAL_TABLET | Freq: Once | ORAL | Status: AC
  Administered 2015-04-12: 100 mg via ORAL
  Filled 2015-04-12: qty 1

## 2015-04-12 MED ORDER — IBUPROFEN 800 MG PO TABS
800.0000 mg | ORAL_TABLET | Freq: Once | ORAL | Status: AC
  Administered 2015-04-12: 800 mg via ORAL
  Filled 2015-04-12: qty 1

## 2015-04-12 MED ORDER — DOXYCYCLINE HYCLATE 100 MG PO CAPS: 100.0000 mg | ORAL_CAPSULE | Freq: Two times a day (BID) | ORAL | Status: DC

## 2015-04-12 NOTE — ED Notes (Signed)
Having dental pain to bottom right, rates pain 7/10.  Leg pain bilaterally 5/10.

## 2015-04-12 NOTE — Discharge Instructions (Signed)
Dental Abscess A dental abscess is a collection of infected fluid (pus) from a bacterial infection in the inner part of the tooth (pulp). It usually occurs at the end of the tooth's root.  CAUSES   Severe tooth decay.  Trauma to the tooth that allows bacteria to enter into the pulp, such as a broken or chipped tooth. SYMPTOMS   Severe pain in and around the infected tooth.  Swelling and redness around the abscessed tooth or in the mouth or face.  Tenderness.  Pus drainage.  Bad breath.  Bitter taste in the mouth.  Difficulty swallowing.  Difficulty opening the mouth.  Nausea.  Vomiting.  Chills.  Swollen neck glands. DIAGNOSIS  1. A medical and dental history will be taken. 2. An examination will be performed by tapping on the abscessed tooth. 3. X-rays may be taken of the tooth to identify the abscess. TREATMENT The goal of treatment is to eliminate the infection. You may be prescribed antibiotic medicine to stop the infection from spreading. A root canal may be performed to save the tooth. If the tooth cannot be saved, it may be pulled (extracted) and the abscess may be drained.  HOME CARE INSTRUCTIONS  Only take over-the-counter or prescription medicines for pain, fever, or discomfort as directed by your caregiver.  Rinse your mouth (gargle) often with salt water ( tsp salt in 8 oz [250 ml] of warm water) to relieve pain or swelling.  Do not drive after taking pain medicine (narcotics).  Do not apply heat to the outside of your face.  Return to your dentist for further treatment as directed. SEEK MEDICAL CARE IF:  Your pain is not helped by medicine.  Your pain is getting worse instead of better. SEEK IMMEDIATE MEDICAL CARE IF:  You have a fever or persistent symptoms for more than 2-3 days.  You have a fever and your symptoms suddenly get worse.  You have chills or a very bad headache.  You have problems breathing or swallowing.  You have trouble  opening your mouth.  You have swelling in the neck or around the eye. Document Released: 07/30/2005 Document Revised: 04/23/2012 Document Reviewed: 11/07/2010 [facility] Patient Information 2015 [place], [place]. This information is not intended to replace advice given to you by your health care provider. Make sure you discuss any questions you have with your health care provider.  Tick Bite Information Ticks are insects that attach themselves to the skin and draw blood for food. There are various types of ticks. Common types include wood ticks and deer ticks. Most ticks live in shrubs and grassy areas. Ticks can climb onto your body when you make contact with leaves or grass where the tick is waiting. The most common places on the body for ticks to attach themselves are the scalp, neck, armpits, waist, and groin. Most tick bites are harmless, but sometimes ticks carry germs that cause diseases. These germs can be spread to a person during the tick's feeding process. The chance of a disease spreading through a tick bite depends on:   The type of tick.  Time of year.   How long the tick is attached.   Geographic location.  HOW CAN YOU PREVENT TICK BITES? Take these steps to help prevent tick bites when you are outdoors:  Wear protective clothing. Long sleeves and long pants are best.   Wear white clothes so you can see ticks more easily.  Tuck your pant legs into your socks.   If walking  on a trail, stay in the middle of the trail to avoid brushing against bushes.  Avoid walking through areas with long grass.  Put insect repellent on all exposed skin and along boot tops, pant legs, and sleeve cuffs.   Check clothing, hair, and skin repeatedly and before going inside.   Brush off any ticks that are not attached.  Take a shower or bath as soon as possible after being outdoors.  WHAT IS THE PROPER WAY TO REMOVE A TICK? Ticks should be removed as soon as possible to help  prevent diseases caused by tick bites. 4. If latex gloves are available, put them on before trying to remove a tick.  5. Using fine-point tweezers, grasp the tick as close to the skin as possible. You may also use curved forceps or a tick removal tool. Grasp the tick as close to its head as possible. Avoid grasping the tick on its body. 6. Pull gently with steady upward pressure until the tick lets go. Do not twist the tick or jerk it suddenly. This may break off the tick's head or mouth parts. 7. Do not squeeze or crush the tick's body. This could force disease-carrying fluids from the tick into your body.  8. After the tick is removed, wash the bite area and your hands with soap and water or other disinfectant such as alcohol. 9. Apply a small amount of antiseptic cream or ointment to the bite site.  10. Wash and disinfect any instruments that were used.  Do not try to remove a tick by applying a hot match, petroleum jelly, or fingernail polish to the tick. These methods do not work and may increase the chances of disease being spread from the tick bite.  WHEN SHOULD YOU SEEK MEDICAL CARE? Contact your health care provider if you are unable to remove a tick from your skin or if a part of the tick breaks off and is stuck in the skin.  After a tick bite, you need to be aware of signs and symptoms that could be related to diseases spread by ticks. Contact your health care provider if you develop any of the following in the days or weeks after the tick bite:  Unexplained fever.  Rash. A circular rash that appears days or weeks after the tick bite may indicate the possibility of Lyme disease. The rash may resemble a target with a bull's-eye and may occur at a different part of your body than the tick bite.  Redness and swelling in the area of the tick bite.   Tender, swollen lymph glands.   Diarrhea.   Weight loss.   Cough.   Fatigue.   Muscle, joint, or bone pain.   Abdominal  pain.   Headache.   Lethargy or a change in your level of consciousness.  Difficulty walking or moving your legs.   Numbness in the legs.   Paralysis.  Shortness of breath.   Confusion.   Repeated vomiting.  Document Released: 07/27/2000 Document Revised: 05/20/2013 Document Reviewed: 01/07/2013 [facility] Patient Information 2015 [place], [place]. This information is not intended to replace advice given to you by your health care provider. Make sure you discuss any questions you have with your health care provider.

## 2015-04-13 NOTE — ED Provider Notes (Signed)
CSN: [id number]     Arrival date & time 04/12/15  1401 History   First MD Initiated Contact with Patient 04/12/15 1445     Chief Complaint  Patient presents with  . Dental Pain  . Leg Pain     (Consider location/radiation/quality/duration/timing/severity/associated sxs/prior Treatment) The history is provided by the patient.   Samuel Powell is a 34 y.o. male with complaints of dental pain since loosing a filling along his right lateral lower tooth which has now caused gingival swelling for the past 5 days.  He had leftover penicillin which he has taken for the past 4 days, took 500 mg bid x 4 days with improvement in swelling but is still having pain.  He also endorses bilateral lower thigh and knee aching pain which started over the past day.  He describes removing a tiny embedded tick from his right lateral hip 2 days ago.  He denies fevers, rash, nausea or vomiting, cough, sob, abdominal pain, headache, uri sx but does endorse feeling "flulike" with the aching in his legs.  He has had no medicines prior to arrival for the aching pain.     Past Medical History  Diagnosis Date  . Kidney stones    History reviewed. No pertinent past surgical history. History reviewed. No pertinent family history. Social History  Substance Use Topics  . Smoking status: Current Every Day Smoker    Types: Cigarettes  . Smokeless tobacco: None  . Alcohol Use: No    Review of Systems  Constitutional: Negative for fever.  HENT: Positive for dental problem. Negative for facial swelling and sore throat.   Respiratory: Negative for shortness of breath.   Musculoskeletal: Positive for myalgias. Negative for neck pain and neck stiffness.  Skin: Positive for wound.      Allergies  Review of patient's allergies indicates no known allergies.  Home Medications   Prior to Admission medications   Medication Sig Start Date End Date Taking? Authorizing Provider  penicillin v potassium (VEETID) 500 MG  tablet Take 500 mg by mouth 4 (four) times daily.   Yes Historical Provider, MD  doxycycline (VIBRAMYCIN) 100 MG capsule Take 1 capsule (100 mg total) by mouth 2 (two) times daily. 04/12/15   Burgess Amor, PA-C  HYDROcodone-acetaminophen (NORCO/VICODIN) 5-325 MG per tablet Take 1 tablet by mouth every 4 (four) hours as needed for moderate pain. Patient not taking: Reported on 01/10/2015 10/11/13   Burgess Amor, PA-C  ibuprofen (ADVIL,MOTRIN) 800 MG tablet Take 1 tablet (800 mg total) by mouth 3 (three) times daily. 04/12/15   Burgess Amor, PA-C  oxyCODONE-acetaminophen (PERCOCET/ROXICET) 5-325 MG per tablet Take 1 tablet by mouth every 6 (six) hours as needed. Patient not taking: Reported on 04/12/2015 01/10/15   Bethann Berkshire, MD  oxyCODONE-acetaminophen (PERCOCET/ROXICET) 5-325 MG per tablet Take 1 tablet by mouth every 6 (six) hours as needed. Patient not taking: Reported on 04/12/2015 01/10/15   Bethann Berkshire, MD  sulfacetamide (BLEPH-10) 10 % ophthalmic solution Place 1-2 drops into both eyes every 4 (four) hours. Patient not taking: Reported on 01/10/2015 02/04/14   Dione Booze, MD   BP 139/83 mmHg  Pulse 78  Temp(Src) 98.6 F (37 C) (Oral)  Resp 20  Ht 6' (1.829 m)  Wt 145 lb (65.772 kg)  BMI 19.66 kg/m2  SpO2 100% Physical Exam  Constitutional: He is oriented to person, place, and time. He appears well-developed and well-nourished. No distress.  HENT:  Head: Normocephalic and atraumatic.  Right Ear: Tympanic membrane  and external ear normal.  Left Ear: Tympanic membrane and external ear normal.  Mouth/Throat: Oropharynx is clear and moist and mucous membranes are normal. No oral lesions. No trismus in the jaw. No dental abscesses.    Eyes: Conjunctivae are normal.  Neck: Normal range of motion. Neck supple.  Cardiovascular: Normal rate and normal heart sounds.   Pulmonary/Chest: Effort normal.  Abdominal: He exhibits no distension.  Musculoskeletal: Normal range of motion.    Lymphadenopathy:    He has no cervical adenopathy.  Neurological: He is alert and oriented to person, place, and time.  Skin: Skin is warm and dry. No rash noted. There is erythema.  1 cm raised induration right lateral hip at site of tick bite, no retained fb, no target lesion or red streaking.  Psychiatric: He has a normal mood and affect.    ED Course  Procedures (including critical care time) Labs Review Labs Reviewed - No data to display  Imaging Review No results found. I have personally reviewed and evaluated these images and lab results as part of my medical decision-making.   EKG Interpretation None      MDM   Final diagnoses:  Dental abscess  Tick bite  Myalgia    Dental infection with improved swelling since pt has taken short course of penicillin. No evidence for current abscess.  With the tick bite exposure with myalgias concern for possible tick infection. Will cover with doxycycline.  Pt plans to f/u with his dentist this week for dental extraction (is currently getting teeth pulled by Dr. Mylinda Latina over time).  Prescribed hydrocodone for pain relief. Info given re tick borne infections, advised recheck here for any worsened or new sx.  Discussed with Dr. Estell Harpin prior to dc home.    Burgess Amor, PA-C 04/13/15 1446  Bethann Berkshire, MD 04/14/15 Paulo Fruit

## 2015-05-25 ENCOUNTER — Emergency Department (HOSPITAL_COMMUNITY)
Admission: EM | Admit: 2015-05-25 | Discharge: 2015-05-25 | Disposition: A | Discharge status: Home / Self Care | Payer: Self-pay | Attending: Emergency Medicine | Admitting: Emergency Medicine

## 2015-05-25 ENCOUNTER — Encounter (HOSPITAL_COMMUNITY): Payer: Self-pay | Admitting: *Deleted

## 2015-05-25 ENCOUNTER — Emergency Department (HOSPITAL_COMMUNITY): Payer: Self-pay

## 2015-05-25 DIAGNOSIS — R109 Unspecified abdominal pain: Secondary | ICD-10-CM

## 2015-05-25 DIAGNOSIS — N200 Calculus of kidney: Secondary | ICD-10-CM | POA: Insufficient documentation

## 2015-05-25 DIAGNOSIS — Z720 Tobacco use: Secondary | ICD-10-CM | POA: Insufficient documentation

## 2015-05-25 DIAGNOSIS — R8299 Other abnormal findings in urine: Secondary | ICD-10-CM | POA: Insufficient documentation

## 2015-05-25 LAB — COMPREHENSIVE METABOLIC PANEL
ALT: 13 U/L — ABNORMAL LOW (ref 17–63)
AST: 19 U/L (ref 15–41)
Albumin: 4.7 g/dL (ref 3.5–5.0)
Alkaline Phosphatase: 68 U/L (ref 38–126)
Anion gap: 10 (ref 5–15)
BUN: 8 mg/dL (ref 6–20)
CO2: 29 mmol/L (ref 22–32)
Calcium: 9.4 mg/dL (ref 8.9–10.3)
Chloride: 100 mmol/L — ABNORMAL LOW (ref 101–111)
Creatinine, Ser: 0.86 mg/dL (ref 0.61–1.24)
GFR calc Af Amer: >60 mL/min (ref >60)
GFR calc non Af Amer: >60 mL/min (ref >60)
Glucose, Bld: 116 mg/dL — ABNORMAL HIGH (ref 65–99)
Potassium: 4.2 mmol/L (ref 3.5–5.1)
Sodium: 139 mmol/L (ref 135–145)
Total Bilirubin: 0.6 mg/dL (ref 0.3–1.2)
Total Protein: 7.6 g/dL (ref 6.5–8.1)

## 2015-05-25 LAB — CBC WITH DIFFERENTIAL/PLATELET
Basophils Absolute: 0 10*3/uL (ref 0.0–0.1)
Basophils Relative: 1 %
Eosinophils Absolute: 0.1 10*3/uL (ref 0.0–0.7)
Eosinophils Relative: 1 %
HCT: 45.3 % (ref 39.0–52.0)
Hemoglobin: 16 g/dL (ref 13.0–17.0)
Lymphocytes Relative: 29 %
Lymphs Abs: 1.7 10*3/uL (ref 0.7–4.0)
MCH: 33.6 pg (ref 26.0–34.0)
MCHC: 35.3 g/dL (ref 30.0–36.0)
MCV: 95.2 fL (ref 78.0–100.0)
Monocytes Absolute: 0.4 10*3/uL (ref 0.1–1.0)
Monocytes Relative: 7 %
Neutro Abs: 3.8 10*3/uL (ref 1.7–7.7)
Neutrophils Relative %: 62 %
Platelets: 153 10*3/uL (ref 150–400)
RBC: 4.76 MIL/uL (ref 4.22–5.81)
RDW: 12.9 % (ref 11.5–15.5)
WBC: 6 10*3/uL (ref 4.0–10.5)

## 2015-05-25 LAB — URINALYSIS, ROUTINE W REFLEX MICROSCOPIC
Bilirubin Urine: NEGATIVE
Glucose, UA: NEGATIVE mg/dL
Ketones, ur: NEGATIVE mg/dL
Leukocytes, UA: NEGATIVE
Nitrite: NEGATIVE
Protein, ur: NEGATIVE mg/dL
Specific Gravity, Urine: 1.02 (ref 1.005–1.030)
Urobilinogen, UA: 0.2 mg/dL (ref 0.0–1.0)
pH: 6 (ref 5.0–8.0)

## 2015-05-25 LAB — URINE MICROSCOPIC-ADD ON

## 2015-05-25 MED ORDER — ONDANSETRON HCL 4 MG/2ML IJ SOLN
4.0000 mg | Freq: Once | INTRAMUSCULAR | Status: AC
  Administered 2015-05-25: 4 mg via INTRAVENOUS
  Filled 2015-05-25: qty 2

## 2015-05-25 MED ORDER — SODIUM CHLORIDE 0.9 % IV BOLUS (SEPSIS)
1000.0000 mL | Freq: Once | INTRAVENOUS | Status: AC
  Administered 2015-05-25: 1000 mL via INTRAVENOUS

## 2015-05-25 MED ORDER — OXYCODONE-ACETAMINOPHEN 5-325 MG PO TABS
1.0000 | ORAL_TABLET | Freq: Once | ORAL | Status: AC
  Administered 2015-05-25: 1 via ORAL
  Filled 2015-05-25: qty 1

## 2015-05-25 NOTE — ED Notes (Signed)
Went to check on pt. To see how much iv fluid left. Iv fluid had been disconnected & gown laying on bed. There was no angiocath found in the trash can. Secretary called law enforcement to verify IV had been removed from the left forearm.

## 2015-05-25 NOTE — ED Notes (Signed)
c com returned call. Advised pt was not at residence. Pt mother was going to try & get in touch w/ pt. No further information provided by [employer].

## 2015-05-25 NOTE — ED Notes (Signed)
Pt complaining of right flank pain that started today. Pt reports history of kidney stones

## 2015-05-25 NOTE — ED Provider Notes (Signed)
CSN: [id number]     Arrival date & time 05/25/15  1856 History   First MD Initiated Contact with Patient 05/25/15 1901     Chief Complaint  Patient presents with  . Flank Pain     (Consider location/radiation/quality/duration/timing/severity/associated sxs/prior Treatment) HPI Comments: 34yo M w/ h/o kidney stones requiring stenting who p/w R flank pain. At 4:30pm today, he woke up with severe, constant R flank pain that radiates into his R testicle. He endorses associated dysuria but denies any hematuria. No fevers. He has felt nauseated but has had no vomiting. No diarrhea or abdominal pain. No testicular swelling or tenderness.  Patient is a 34 y.o. male presenting with flank pain. The history is provided by the patient.  Flank Pain    Past Medical History  Diagnosis Date  . Kidney stones    History reviewed. No pertinent past surgical history. No family history on file. Social History  Substance Use Topics  . Smoking status: Current Every Day Smoker    Types: Cigarettes  . Smokeless tobacco: None  . Alcohol Use: No    Review of Systems  Genitourinary: Positive for flank pain.   10 Systems reviewed and are negative for acute change except as noted in the HPI.    Allergies  Review of patient's allergies indicates no known allergies.  Home Medications   Prior to Admission medications   Medication Sig Start Date End Date Taking? Authorizing Provider  ibuprofen (ADVIL,MOTRIN) 200 MG tablet Take 200 mg by mouth every 6 (six) hours as needed for mild pain or moderate pain.   Yes Historical Provider, MD  doxycycline (VIBRAMYCIN) 100 MG capsule Take 1 capsule (100 mg total) by mouth 2 (two) times daily. Patient not taking: Reported on 05/25/2015 04/12/15   Burgess Amor, PA-C   BP 149/92 mmHg  Pulse 67  Temp(Src) 97.8 F (36.6 C) (Oral)  Resp 18  Ht 6' (1.829 m)  Wt 145 lb (65.772 kg)  BMI 19.66 kg/m2  SpO2 100% Physical Exam  Constitutional: He is oriented to  person, place, and time. He appears well-developed and well-nourished.  Uncomfortable, laying on right side  HENT:  Head: Normocephalic and atraumatic.  Moist mucous membranes  Eyes: Conjunctivae are normal. Pupils are equal, round, and reactive to light.  Neck: Neck supple.  Cardiovascular: Normal rate, regular rhythm and normal heart sounds.   No murmur heard. Pulmonary/Chest: Effort normal and breath sounds normal.  Abdominal: Soft. Bowel sounds are normal. He exhibits no distension. There is no tenderness.  Genitourinary:  No CVA tenderness  Musculoskeletal: He exhibits no edema.  Neurological: He is alert and oriented to person, place, and time.  Fluent speech  Skin: Skin is warm and dry.  Psychiatric: He has a normal mood and affect. Judgment normal.  Nursing note and vitals reviewed.   ED Course  Procedures (including critical care time) Labs Review Labs Reviewed  URINALYSIS, ROUTINE W REFLEX MICROSCOPIC (NOT AT [facility]) - Abnormal; Notable for the following:    Hgb urine dipstick SMALL (*)    All other components within normal limits  COMPREHENSIVE METABOLIC PANEL - Abnormal; Notable for the following:    Chloride 100 (*)    Glucose, Bld 116 (*)    ALT 13 (*)    All other components within normal limits  URINE MICROSCOPIC-ADD ON - Abnormal; Notable for the following:    Crystals CA OXALATE CRYSTALS (*)    All other components within normal limits  CBC WITH DIFFERENTIAL/PLATELET  Imaging Review Ct Renal Stone Study  05/25/2015  CLINICAL DATA:  Right flank pain beginning 3 p.m. today. EXAM: CT ABDOMEN AND PELVIS WITHOUT CONTRAST TECHNIQUE: Multidetector CT imaging of the abdomen and pelvis was performed following the standard protocol without IV contrast. COMPARISON:  01/10/2015 FINDINGS: Lung bases are clear.  No effusions.  Heart is normal size. Liver, gallbladder, spleen, pancreas, adrenals have an unremarkable unenhanced appearance. There are a few small punctate  nonobstructing bilateral renal stones. No ureteral stones or hydronephrosis. Urinary bladder is unremarkable. Appendix is visualized and is normal. Bowel grossly unremarkable. No free fluid, free air, or adenopathy. Aorta is normal caliber. No acute bony abnormality. IMPRESSION: Small bilateral punctate nephrolithiasis. No ureteral stones or hydronephrosis. Electronically Signed   By: Charlett NoseKevin  Dover M.D.   On: 05/25/2015 20:09   I have personally reviewed and evaluated these lab results as part of my medical decision-making.   EKG Interpretation None     Medications  ondansetron (ZOFRAN) injection 4 mg (4 mg Intravenous Given 05/25/15 2000)  sodium chloride 0.9 % bolus 1,000 mL (1,000 mLs Intravenous New Bag/Given 05/25/15 2000)  oxyCODONE-acetaminophen (PERCOCET/ROXICET) 5-325 MG per tablet 1 tablet (1 tablet Oral Given 05/25/15 2001)     MDM   Final diagnoses:  Rt flank pain   34 year old male who presents with a sudden onset of right flank pain that began earlier today associated with dysuria. Patient was uncomfortable on exam. Vital signs notable for mild hypertension. No abdominal tenderness. Obtained above labs and given patient's history of stenting, obtained CT of the abdomen and pelvis to evaluate for obstructing stone. Gave the patient Zofran, IV fluids, and Percocet.  Labwork shows normal creatinine, UA containing small amount of blood and calcium oxalate crystals. No evidence of infection. CT shows small bilateral punctate nephrolithiasis without any ureteral stones or hydronephrosis. Given the presence of calcium oxalate crystals in the urine, I suspect that he has passed some tiny stones but has no evidence of obstruction now. On reexamination, the patient appears comfortable. I have reviewed findings with him and stressed the importance of following up with a urologist for further management. The patient states that he will soon be acquiring insurance through his job which should  facilitate specialist follow-up. Reviewed return precautions including fever and patient voice understanding. Patient was discharged in satisfactory condition.  Laurence Spatesachel Morgan Little, MD 05/25/15 2033

## 2015-05-25 NOTE — ED Notes (Signed)
Pt left w/o dc paperwork. No iv catheter found in room. Iv fluid had been disconnected from pigtail. [facility] Pt left department w/ signing or speaking to anyone.

## 2016-03-06 ENCOUNTER — Encounter (HOSPITAL_COMMUNITY): Payer: Self-pay | Admitting: *Deleted

## 2016-03-06 ENCOUNTER — Emergency Department (HOSPITAL_COMMUNITY)
Admission: EM | Admit: 2016-03-06 | Discharge: 2016-03-06 | Disposition: A | Discharge status: Home / Self Care | Payer: Self-pay | Attending: Emergency Medicine | Admitting: Emergency Medicine

## 2016-03-06 DIAGNOSIS — K029 Dental caries, unspecified: Secondary | ICD-10-CM

## 2016-03-06 DIAGNOSIS — Z79899 Other long term (current) drug therapy: Secondary | ICD-10-CM | POA: Insufficient documentation

## 2016-03-06 DIAGNOSIS — F17210 Nicotine dependence, cigarettes, uncomplicated: Secondary | ICD-10-CM | POA: Insufficient documentation

## 2016-03-06 DIAGNOSIS — Z791 Long term (current) use of non-steroidal anti-inflammatories (NSAID): Secondary | ICD-10-CM | POA: Insufficient documentation

## 2016-03-06 MED ORDER — IBUPROFEN 800 MG PO TABS
800.0000 mg | ORAL_TABLET | Freq: Once | ORAL | Status: AC
  Administered 2016-03-06: 800 mg via ORAL
  Filled 2016-03-06: qty 1

## 2016-03-06 MED ORDER — CLINDAMYCIN HCL 150 MG PO CAPS
300.0000 mg | ORAL_CAPSULE | Freq: Once | ORAL | Status: AC
  Administered 2016-03-06: 300 mg via ORAL
  Filled 2016-03-06: qty 2

## 2016-03-06 MED ORDER — TRAMADOL HCL 50 MG PO TABS
50.0000 mg | ORAL_TABLET | Freq: Four times a day (QID) | ORAL | 0 refills | Status: DC | PRN
Start: 2016-03-06 — End: 2017-12-01

## 2016-03-06 MED ORDER — CLINDAMYCIN HCL 150 MG PO CAPS: ORAL_CAPSULE | ORAL | 0 refills | Status: DC

## 2016-03-06 NOTE — ED Triage Notes (Signed)
Pt with right lower broken tooth, pain since Saturday night.  Also right facial swelling since yesterday.

## 2016-03-06 NOTE — ED Provider Notes (Signed)
[facility]-EMERGENCY DEPT Provider Note   CSN: [id number] Arrival date & time: 03/06/16  1535  First Provider Contact:  None       History   Chief Complaint Chief Complaint  Patient presents with  . Dental Pain    HPI Samuel Powell is a 35 y.o. male.  No fever or chills related to the tooth pain. A tooth at the right lower jaw area broke and pt is having increasing pain.   The history is provided by the patient.  Dental Pain   This is a chronic problem. Episode onset: Chronic pain, Pain worse 2 days ago. The problem has been gradually worsening. The pain is at a severity of 9/10. The pain is moderate. He has tried acetaminophen for the symptoms. The treatment provided no relief.    Past Medical History:  Diagnosis Date  . Kidney stones     There are no active problems to display for this patient.   History reviewed. No pertinent surgical history.     Home Medications    Prior to Admission medications   Medication Sig Start Date End Date Taking? Authorizing Provider  doxycycline (VIBRAMYCIN) 100 MG capsule Take 1 capsule (100 mg total) by mouth 2 (two) times daily. Patient not taking: Reported on 05/25/2015 04/12/15   Burgess Amor, PA-C  ibuprofen (ADVIL,MOTRIN) 200 MG tablet Take 200 mg by mouth every 6 (six) hours as needed for mild pain or moderate pain.    Historical Provider, MD    Family History History reviewed. No pertinent family history.  Social History Social History  Substance Use Topics  . Smoking status: Current Every Day Smoker    Types: Cigarettes  . Smokeless tobacco: Never Used  . Alcohol use No     Allergies   Review of patient's allergies indicates no known allergies.   Review of Systems Review of Systems  HENT: Positive for dental problem.   All other systems reviewed and are negative.    Physical Exam Updated Vital Signs BP 139/84 (BP Location: Left Arm)   Pulse 99   Temp 97.9 F (36.6 C) (Oral)   Resp 18   Ht 6\' 2"  (1.88 m)    Wt 70.3 kg   SpO2 100%   BMI 19.90 kg/m   Physical Exam  Constitutional: He is oriented to person, place, and time. He appears well-developed and well-nourished.  Non-toxic appearance.  HENT:  Head: Normocephalic.  Right Ear: Tympanic membrane and external ear normal.  Left Ear: Tympanic membrane and external ear normal.  Multiple dental caries noted. There is a broken tooth in the area of a cavity of the second right lower molar. There is a cavity also in the third right lower molar. There is swelling of the gum, but no visible abscess. The airway is patent. There is no swelling under the tongue.  Eyes: EOM and lids are normal. Pupils are equal, round, and reactive to light.  Neck: Normal range of motion. Neck supple. Carotid bruit is not present.  Cardiovascular: Normal rate, regular rhythm, normal heart sounds, intact distal pulses and normal pulses.   Pulmonary/Chest: Breath sounds normal. No respiratory distress.  Abdominal: Soft. Bowel sounds are normal. There is no tenderness. There is no guarding.  Musculoskeletal: Normal range of motion.  Lymphadenopathy:       Head (right side): No submandibular adenopathy present.       Head (left side): No submandibular adenopathy present.    He has no cervical adenopathy.  Neurological: He  is alert and oriented to person, place, and time. He has normal strength. No cranial nerve deficit or sensory deficit.  Skin: Skin is warm and dry.  Psychiatric: He has a normal mood and affect. His speech is normal.  Nursing note and vitals reviewed.    ED Treatments / Results  Labs (all labs ordered are listed, but only abnormal results are displayed) Labs Reviewed - No data to display  EKG  EKG Interpretation None       Radiology No results found.  Procedures Procedures (including critical care time)  Medications Ordered in ED Medications - No data to display   Initial Impression / Assessment and Plan / ED Course  I have  reviewed the triage vital signs and the nursing notes.  Pertinent labs & imaging results that were available during my care of the patient were reviewed by me and considered in my medical decision making (see chart for details).  Clinical Course    **I have reviewed nursing notes, vital signs, and all appropriate lab and imaging results for this patient.*  Final Clinical Impressions(s) / ED Diagnoses  Patient has an infected dental [place] on the right lower jaw area. No evidence of an abscess. The examination is negative for Ludwig's angina or other emergent oral conditions. Patient will be treated with clindamycin and Ultram. Patient states he is scheduled to see Dr. Mylinda Latina next month.    Final diagnoses:  None    New Prescriptions New Prescriptions   No medications on file     Ivery Quale, Cordelia Poche 03/07/16 2332    Zadie Rhine, MD 03/07/16 [phone]

## 2016-03-06 NOTE — ED Provider Notes (Signed)
[facility]-EM[facility]Y DEPT Provider Note   CSN: [id number] Arrival date & time: 03/06/16  1535  First Provider Contact 4:24       History   Chief Complaint Chief Complaint  Patient presents with  . Dental Pain    HPI DAJHON GRIFFIS is a 35 y.o. male.  The history is provided by the patient.  Dental Pain   This is a chronic problem. The current episode started more than 2 days ago. The problem has been gradually worsening. The pain is moderate. Treatments tried: ibuprofen. The treatment provided no relief.    Past Medical History:  Diagnosis Date  . Kidney stones     There are no active problems to display for this patient.   History reviewed. No pertinent surgical history.     Home Medications    Prior to Admission medications   Medication Sig Start Date End Date Taking? Authorizing Provider  doxycycline (VIBRAMYCIN) 100 MG capsule Take 1 capsule (100 mg total) by mouth 2 (two) times daily. Patient not taking: Reported on 05/25/2015 04/12/15   Burgess Amor, PA-C  ibuprofen (ADVIL,MOTRIN) 200 MG tablet Take 200 mg by mouth every 6 (six) hours as needed for mild pain or moderate pain.    Historical Provider, MD    Family History History reviewed. No pertinent family history.  Social History Social History  Substance Use Topics  . Smoking status: Current Every Day Smoker    Types: Cigarettes  . Smokeless tobacco: Never Used  . Alcohol use No     Allergies   Review of patient's allergies indicates no known allergies.   Review of Systems Review of Systems  HENT: Positive for dental problem. Negative for tinnitus and trouble swallowing.      Physical Exam Updated Vital Signs BP 139/84 (BP Location: Left Arm)   Pulse 99   Temp 97.9 F (36.6 C) (Oral)   Resp 18   Ht 6\' 2"  (1.88 m)   Wt 70.3 kg   SpO2 100%   BMI 19.90 kg/m   Physical Exam  Constitutional: He is oriented to person, place, and time. He appears well-developed and well-nourished.  Non-toxic  appearance.  HENT:  Head: Normocephalic.  Right Ear: Tympanic membrane and external ear normal.  Left Ear: Tympanic membrane and external ear normal.  Mouth/Throat: No trismus in the jaw. Abnormal dentition. Dental caries present. No uvula swelling.  Eyes: EOM and lids are normal. Pupils are equal, round, and reactive to light.  Neck: Normal range of motion. Neck supple. Carotid bruit is not present.  Cardiovascular: Normal rate, regular rhythm, normal heart sounds, intact distal pulses and normal pulses.   Pulmonary/Chest: Breath sounds normal. No respiratory distress.  Abdominal: Soft. Bowel sounds are normal. There is no tenderness. There is no guarding.  Musculoskeletal: Normal range of motion.  Lymphadenopathy:       Head (right side): No submandibular adenopathy present.       Head (left side): No submandibular adenopathy present.    He has no cervical adenopathy.  Neurological: He is alert and oriented to person, place, and time. He has normal strength. No cranial nerve deficit or sensory deficit.  Skin: Skin is warm and dry.  Psychiatric: He has a normal mood and affect. His speech is normal.  Nursing note and vitals reviewed.    ED Treatments / Results  Labs (all labs ordered are listed, but only abnormal results are displayed) Labs Reviewed - No data to display  EKG  EKG Interpretation None  Radiology No results found.  Procedures Procedures (including critical care time)  Medications Ordered in ED Medications - No data to display   Initial Impression / Assessment and Plan / ED Course  I have reviewed the triage vital signs and the nursing notes.  Pertinent labs & imaging results that were available during my care of the patient were reviewed by me and considered in my medical decision making (see chart for details).  Clinical Course    **I have reviewed nursing notes, vital signs, and all appropriate lab and imaging results for this  patient.*  Final Clinical Impressions(s) / ED Diagnoses   Final diagnoses:  None    New Prescriptions New Prescriptions   No medications on file     Ivery Quale, Cordelia Poche 03/06/16 1635    Zadie Rhine, MD 03/06/16 2300

## 2016-09-23 ENCOUNTER — Encounter (HOSPITAL_COMMUNITY): Payer: Self-pay | Admitting: Emergency Medicine

## 2016-09-23 ENCOUNTER — Emergency Department (HOSPITAL_COMMUNITY)
Admission: EM | Admit: 2016-09-23 | Discharge: 2016-09-23 | Disposition: A | Discharge status: Home / Self Care | Payer: Self-pay | Attending: Emergency Medicine | Admitting: Emergency Medicine

## 2016-09-23 DIAGNOSIS — K0889 Other specified disorders of teeth and supporting structures: Secondary | ICD-10-CM

## 2016-09-23 DIAGNOSIS — K029 Dental caries, unspecified: Secondary | ICD-10-CM | POA: Insufficient documentation

## 2016-09-23 DIAGNOSIS — Z79899 Other long term (current) drug therapy: Secondary | ICD-10-CM | POA: Insufficient documentation

## 2016-09-23 DIAGNOSIS — F17210 Nicotine dependence, cigarettes, uncomplicated: Secondary | ICD-10-CM | POA: Insufficient documentation

## 2016-09-23 MED ORDER — CLINDAMYCIN HCL 150 MG PO CAPS: 450.0000 mg | ORAL_CAPSULE | Freq: Three times a day (TID) | ORAL | 0 refills | Status: DC

## 2016-09-23 NOTE — ED Triage Notes (Signed)
Patient complains of toothache that started last week. Lower right rear most molar.

## 2016-09-23 NOTE — ED Provider Notes (Signed)
[facility] Provider Note   CSN: [id number] Arrival date & time: 09/23/16  1555  By signing my name below, I, Cynda Acres, attest that this documentation has been prepared under the direction and in the presence of Buel Ream PA-C Electronically Signed: Cynda Acres, Scribe. 09/23/16. 6:25 PM.   History   Chief Complaint Chief Complaint  Patient presents with  . Dental Pain   HPI Comments: Samuel Powell is a 36 y.o. male who presents to the Emergency Department complaining of a sudden-onset, constant dental pain to the lower right molar. Patient states he had a pressure shoot up into his ear and into his temple. Patient had associated ear pain (no longer present). Patient states the pain is worse when hit by air. Patient does have a primary care dentist. Patient states the last time he was on antibiotics was 6 months ago. Denies any allergies. Patient denies any abdominal pain, facial swelling, flu symptoms, nausea, vomiting, or diarrhea.   The history is provided by the patient. No language interpreter was used.    Past Medical History:  Diagnosis Date  . Kidney stones     There are no active problems to display for this patient.   History reviewed. No pertinent surgical history.     Home Medications    Prior to Admission medications   Medication Sig Start Date End Date Taking? Authorizing Provider  ibuprofen (ADVIL,MOTRIN) 200 MG tablet Take 200 mg by mouth every 6 (six) hours as needed for mild pain or moderate pain.   Yes Historical Provider, MD  traMADol (ULTRAM) 50 MG tablet Take 1 tablet (50 mg total) by mouth every 6 (six) hours as needed. 03/06/16  Yes Ivery Quale, PA-C  clindamycin (CLEOCIN) 150 MG capsule Take 3 capsules (450 mg total) by mouth 3 (three) times daily. 09/23/16   Emi Holes, PA-C    Family History No family history on file.  Social History Social History  Substance Use Topics  . Smoking status: Current Every Day Smoker   Types: Cigarettes  . Smokeless tobacco: Never Used  . Alcohol use No     Allergies   Patient has no known allergies.   Review of Systems Review of Systems  Constitutional: Negative for chills and fever.  HENT: Positive for dental problem. Negative for facial swelling and sore throat.   Respiratory: Negative for shortness of breath.   Cardiovascular: Negative for chest pain.  Gastrointestinal: Negative for abdominal pain, nausea and vomiting.  Genitourinary: Negative for dysuria.  Musculoskeletal: Negative for back pain.  Skin: Negative for rash and wound.  Neurological: Negative for headaches.  Psychiatric/Behavioral: The patient is not nervous/anxious.      Physical Exam Updated Vital Signs BP 136/74 (BP Location: Left Arm)   Pulse 79   Temp 98 F (36.7 C) (Oral)   Resp 17   Ht 6' (1.829 m)   Wt 68 kg   SpO2 100%   BMI 20.34 kg/m   Physical Exam  Constitutional: He appears well-developed and well-nourished. No distress.  HENT:  Head: Normocephalic and atraumatic.  Mouth/Throat: Oropharynx is clear and moist. No trismus in the jaw. Abnormal dentition. No dental abscesses. No oropharyngeal exudate, posterior oropharyngeal edema, posterior oropharyngeal erythema or tonsillar abscesses.    Eyes: Conjunctivae are normal. Pupils are equal, round, and reactive to light. Right eye exhibits no discharge. Left eye exhibits no discharge. No scleral icterus.  Neck: Normal range of motion. Neck supple. No thyromegaly present.  No masses.  Cardiovascular: Normal rate, regular rhythm, normal heart sounds and intact distal pulses.  Exam reveals no gallop and no friction rub.   No murmur heard. Pulmonary/Chest: Effort normal and breath sounds normal. No stridor. No respiratory distress. He has no wheezes. He has no rales.  Abdominal: Soft. Bowel sounds are normal. He exhibits no distension. There is no tenderness. There is no rebound and no guarding.  Musculoskeletal: He exhibits  no edema.  Lymphadenopathy:    He has no cervical adenopathy.  Neurological: He is alert. Coordination normal.  Skin: Skin is warm and dry. No rash noted. He is not diaphoretic. No pallor.  Psychiatric: He has a normal mood and affect.  Nursing note and vitals reviewed.    ED Treatments / Results  DIAGNOSTIC STUDIES: Oxygen Saturation is 100% on RA, normal by my interpretation.    COORDINATION OF CARE: 6:25 PM Discussed treatment plan with pt at bedside and pt agreed to plan, which includes antibiotics.   Labs (all labs ordered are listed, but only abnormal results are displayed) Labs Reviewed - No data to display  EKG  EKG Interpretation None       Radiology No results found.  Procedures Procedures (including critical care time)  Medications Ordered in ED Medications - No data to display   Initial Impression / Assessment and Plan / ED Course  I have reviewed the triage vital signs and the nursing notes.  Pertinent labs & imaging results that were available during my care of the patient were reviewed by me and considered in my medical decision making (see chart for details).     Patient with dentalgia.  No abscess requiring immediate incision and drainage.  Exam not concerning for Ludwig's angina or pharyngeal abscess.  Will treat with clindamycin. Pt instructed to follow-up with dentist.  Discussed return precautions. Pt safe for discharge.   Final Clinical Impressions(s) / ED Diagnoses   Final diagnoses:  Pain, dental    New Prescriptions New Prescriptions   CLINDAMYCIN (CLEOCIN) 150 MG CAPSULE    Take 3 capsules (450 mg total) by mouth 3 (three) times daily.   I personally performed the services described in this documentation, which was scribed in my presence. The recorded information has been reviewed and is accurate.     Emi Holeslexandra M Jud Fanguy, PA-C 09/23/16 1829    Vanetta MuldersScott Zackowski, MD 09/24/16 [id number]

## 2016-09-23 NOTE — Discharge Instructions (Signed)
Medications: Clindamycin  Treatment: Take clindamycin 3 times daily for 1 week. Make sure to finish all this medication. You can take your pain medications as prescribed at home for your pain.  Follow-up: Please follow-up with your dentist as soon as possible for further evaluation and treatment of your dental pain. Please return to emergency department if you develop any new or worsening symptoms including a swollen mass around your tooth, masses in your neck, or any other new or concerning symptoms.

## 2017-02-02 ENCOUNTER — Emergency Department (HOSPITAL_COMMUNITY): Payer: Self-pay

## 2017-02-02 ENCOUNTER — Encounter (HOSPITAL_COMMUNITY): Payer: Self-pay | Admitting: *Deleted

## 2017-02-02 ENCOUNTER — Emergency Department (HOSPITAL_COMMUNITY)
Admission: EM | Admit: 2017-02-02 | Discharge: 2017-02-03 | Disposition: A | Discharge status: Home / Self Care | Payer: Self-pay | Attending: Emergency Medicine | Admitting: Emergency Medicine

## 2017-02-02 DIAGNOSIS — Y939 Activity, unspecified: Secondary | ICD-10-CM | POA: Insufficient documentation

## 2017-02-02 DIAGNOSIS — S93402A Sprain of unspecified ligament of left ankle, initial encounter: Secondary | ICD-10-CM | POA: Insufficient documentation

## 2017-02-02 DIAGNOSIS — Y999 Unspecified external cause status: Secondary | ICD-10-CM | POA: Insufficient documentation

## 2017-02-02 DIAGNOSIS — F17210 Nicotine dependence, cigarettes, uncomplicated: Secondary | ICD-10-CM | POA: Insufficient documentation

## 2017-02-02 DIAGNOSIS — X501XXA Overexertion from prolonged static or awkward postures, initial encounter: Secondary | ICD-10-CM | POA: Insufficient documentation

## 2017-02-02 DIAGNOSIS — Y92007 Garden or yard of unspecified non-institutional (private) residence as the place of occurrence of the external cause: Secondary | ICD-10-CM | POA: Insufficient documentation

## 2017-02-02 MED ORDER — IBUPROFEN 800 MG PO TABS
800.0000 mg | ORAL_TABLET | Freq: Once | ORAL | Status: AC
  Administered 2017-02-03: 800 mg via ORAL
  Filled 2017-02-02: qty 1

## 2017-02-02 MED ORDER — IBUPROFEN 800 MG PO TABS: 800.0000 mg | ORAL_TABLET | Freq: Three times a day (TID) | ORAL | 0 refills | Status: DC

## 2017-02-02 NOTE — ED Triage Notes (Signed)
Pt rolled his left ankle in a hole in the yard about 1 hr ago. Pt reports sharp, shooting pains in his left ankle.

## 2017-02-02 NOTE — ED Provider Notes (Signed)
[facility] Provider Note   CSN: [id number] Arrival date & time: 02/02/17  2235     History   Chief Complaint Chief Complaint  Patient presents with  . Ankle Pain    HPI Samuel Powell is a 36 y.o. male presenting with left ankle pain which occurred suddenly when the patient inverted his ankle an hour before arrival when stepping in an unseen hole in the yard.  Pain is aching, constant and worse with palpation, movement and weight bearing.  The patient was able to weight bear immediately after the event but the pain has worsened since then.  Pain radiates into the lower left leg. He denies numbness distal to the injury site and denies knee pain. He has had no medicine or treatment prior to arrival. .  The history is provided by the patient.    Past Medical History:  Diagnosis Date  . Kidney stones     There are no active problems to display for this patient.   History reviewed. No pertinent surgical history.     Home Medications    Prior to Admission medications   Medication Sig Start Date End Date Taking? Authorizing Provider  clindamycin (CLEOCIN) 150 MG capsule Take 3 capsules (450 mg total) by mouth 3 (three) times daily. 09/23/16   Law, Waylan Boga, PA-C  ibuprofen (ADVIL,MOTRIN) 800 MG tablet Take 1 tablet (800 mg total) by mouth 3 (three) times daily. 02/02/17   Burgess Amor, PA-C  traMADol (ULTRAM) 50 MG tablet Take 1 tablet (50 mg total) by mouth every 6 (six) hours as needed. 03/06/16   Ivery Quale, PA-C    Family History History reviewed. No pertinent family history.  Social History Social History  Substance Use Topics  . Smoking status: Current Every Day Smoker    Packs/day: 1.00    Types: Cigarettes  . Smokeless tobacco: Never Used  . Alcohol use No     Allergies   Patient has no known allergies.   Review of Systems Review of Systems  Musculoskeletal: Positive for arthralgias and joint swelling.  Skin: Negative for wound.    Neurological: Negative for weakness and numbness.     Physical Exam Updated Vital Signs BP 133/82 (BP Location: Right Arm)   Pulse 95   Temp 98.2 F (36.8 C) (Oral)   Resp 18   Ht 6' (1.829 m)   Wt 70.3 kg (155 lb)   SpO2 100%   BMI 21.02 kg/m   Physical Exam  Constitutional: He appears well-developed and well-nourished.  HENT:  Head: Normocephalic.  Cardiovascular: Normal rate and intact distal pulses.  Exam reveals no decreased pulses.   Pulses:      Dorsalis pedis pulses are 2+ on the right side, and 2+ on the left side.       Posterior tibial pulses are 2+ on the right side, and 2+ on the left side.  Musculoskeletal: He exhibits edema and tenderness.       Left ankle: He exhibits decreased range of motion and swelling. He exhibits no ecchymosis, no deformity and normal pulse. Tenderness. Lateral malleolus tenderness found. No head of 5th metatarsal and no proximal fibula tenderness found. Achilles tendon normal. Achilles tendon exhibits no defect.  Neurological: He is alert. No sensory deficit.  Skin: Skin is warm, dry and intact.  Nursing note and vitals reviewed.    ED Treatments / Results  Labs (all labs ordered are listed, but only abnormal results are displayed) Labs Reviewed - No data to  display  EKG  EKG Interpretation None       Radiology Dg Ankle Complete Left  Result Date: 02/02/2017 CLINICAL DATA:  Left ankle pain after injury. Rolled ankle in a hole in the yard prior to arrival. EXAM: LEFT ANKLE COMPLETE - 3+ VIEW COMPARISON:  None. FINDINGS: There is no evidence of fracture or dislocation. There is no evidence of arthropathy or other focal bone abnormality. Small tibial talar joint effusion. Soft tissues are unremarkable. IMPRESSION: Small tibiotalar joint effusion.  No fracture or dislocation. Electronically Signed   By: Rubye OaksMelanie  Ehinger M.D.   On: 02/02/2017 23:33    Procedures Procedures (including critical care time)  Medications Ordered in  ED Medications  ibuprofen (ADVIL,MOTRIN) tablet 800 mg (not administered)     Initial Impression / Assessment and Plan / ED Course  I have reviewed the triage vital signs and the nursing notes.  Pertinent labs & imaging results that were available during my care of the patient were reviewed by me and considered in my medical decision making (see chart for details).     [place], crutches,  RICE, prn f/u if sx do not improve, ortho referral given.  Final Clinical Impressions(s) / ED Diagnoses   Final diagnoses:  Sprain of left ankle, unspecified ligament, initial encounter    New Prescriptions New Prescriptions   IBUPROFEN (ADVIL,MOTRIN) 800 MG TABLET    Take 1 tablet (800 mg total) by mouth 3 (three) times daily.     Burgess Amordol, Anabella Capshaw, PA-C 02/02/17 2356    Gilda CreasePollina, Christopher J, MD 02/03/17 [phone]

## 2017-02-02 NOTE — Discharge Instructions (Signed)
Wear the ASO and use crutches to avoid weight bearing until your pain and swelling is improved.  Use ice and elevation as much as possible for the next several days to help reduce the swelling.  Take the ibuprofen for inflammation.  Call the orthopedic doctor listed for a recheck of your injury in 10 days if your symptoms are not improving by then.   You may benefit from physical therapy of your ankle if it is not getting better over the next week.

## 2017-12-01 ENCOUNTER — Encounter (HOSPITAL_COMMUNITY): Payer: Self-pay | Admitting: Emergency Medicine

## 2017-12-01 ENCOUNTER — Other Ambulatory Visit: Payer: Self-pay

## 2017-12-01 ENCOUNTER — Emergency Department (HOSPITAL_COMMUNITY)
Admission: EM | Admit: 2017-12-01 | Discharge: 2017-12-01 | Disposition: A | Discharge status: Home / Self Care | Payer: Self-pay | Attending: Emergency Medicine | Admitting: Emergency Medicine

## 2017-12-01 DIAGNOSIS — K0889 Other specified disorders of teeth and supporting structures: Secondary | ICD-10-CM

## 2017-12-01 DIAGNOSIS — F17210 Nicotine dependence, cigarettes, uncomplicated: Secondary | ICD-10-CM | POA: Insufficient documentation

## 2017-12-01 DIAGNOSIS — K029 Dental caries, unspecified: Secondary | ICD-10-CM | POA: Insufficient documentation

## 2017-12-01 HISTORY — DX: Dental caries, unspecified: K02.9

## 2017-12-01 MED ORDER — TRAMADOL HCL 50 MG PO TABS: ORAL_TABLET | ORAL | 0 refills | Status: DC

## 2017-12-01 MED ORDER — ONDANSETRON HCL 4 MG PO TABS
4.0000 mg | ORAL_TABLET | Freq: Once | ORAL | Status: AC
  Administered 2017-12-01: 4 mg via ORAL
  Filled 2017-12-01: qty 1

## 2017-12-01 MED ORDER — CLINDAMYCIN HCL 150 MG PO CAPS: ORAL_CAPSULE | ORAL | 0 refills | Status: DC

## 2017-12-01 MED ORDER — IBUPROFEN 800 MG PO TABS
800.0000 mg | ORAL_TABLET | Freq: Once | ORAL | Status: AC
  Administered 2017-12-01: 800 mg via ORAL
  Filled 2017-12-01: qty 1

## 2017-12-01 MED ORDER — CLINDAMYCIN HCL 150 MG PO CAPS
300.0000 mg | ORAL_CAPSULE | Freq: Once | ORAL | Status: AC
  Administered 2017-12-01: 300 mg via ORAL
  Filled 2017-12-01: qty 2

## 2017-12-01 MED ORDER — IBUPROFEN 600 MG PO TABS: 600.0000 mg | ORAL_TABLET | Freq: Four times a day (QID) | ORAL | 0 refills | Status: DC

## 2017-12-01 NOTE — ED Triage Notes (Signed)
Pt reports upper left dental pain "for a while" but worse x 3 days.

## 2017-12-01 NOTE — Discharge Instructions (Addendum)
It is important that she see her dentist as soon as possible.  Please use clindamycin 2 times daily with a meal.  Use ibuprofen and 500 mg of Tylenol with breakfast, lunch, dinner, and at bedtime.  May use Ultram for more severe pain.  Ultram may cause drowsiness.  Please do not drive a vehicle, operate machinery, or participate in activities requiring concentration when taking this medication.

## 2017-12-01 NOTE — ED Provider Notes (Signed)
[facility] EMERGENCY DEPARTMENT Provider Note   CSN: [id number] Arrival date & time: 12/01/17  1559     History   Chief Complaint Chief Complaint  Patient presents with  . Dental Pain    HPI CONG HIGHTOWER is a 37 y.o. male.  Patient is a 37 year old male who presents to the emergency department with a complaint of dental pain.  The patient states he has been having dental pain off and on for some time.  This pain has become progressively worse over the past 3 days.  The pain is mostly on the left side.  The patient states he has a rather severely decayed tooth in the upper jaw on the left.  The pain at times shoots into the ear and temple area.  The patient has not had fever reported.  He is not had any difficulty with speaking or swallowing.  No unusual chest pain.  He presents to the emergency department for assistance with slight discomfort.  He states he has a Education officer, community, and he is going to try to reach that dentist on tomorrow for an appointment.     Past Medical History:  Diagnosis Date  . Dental caries   . Kidney stones     There are no active problems to display for this patient.   No past surgical history on file.      Home Medications    Prior to Admission medications   Medication Sig Start Date End Date Taking? Authorizing Provider  clindamycin (CLEOCIN) 150 MG capsule Take 3 capsules (450 mg total) by mouth 3 (three) times daily. 09/23/16   Law, Waylan Boga, PA-C  ibuprofen (ADVIL,MOTRIN) 800 MG tablet Take 1 tablet (800 mg total) by mouth 3 (three) times daily. 02/02/17   Burgess Amor, PA-C  traMADol (ULTRAM) 50 MG tablet Take 1 tablet (50 mg total) by mouth every 6 (six) hours as needed. 03/06/16   Ivery Quale, PA-C    Family History No family history on file.  Social History Social History   Tobacco Use  . Smoking status: Current Every Day Smoker    Packs/day: 1.00    Types: Cigarettes  . Smokeless tobacco: Never Used  Substance Use Topics  .  Alcohol use: No  . Drug use: No     Allergies   Patient has no known allergies.   Review of Systems Review of Systems  Constitutional: Negative for activity change.       All ROS Neg except as noted in HPI  HENT: Positive for dental problem. Negative for nosebleeds.        Face pain  Eyes: Negative for photophobia and discharge.  Respiratory: Negative for cough, shortness of breath and wheezing.   Cardiovascular: Negative for chest pain and palpitations.  Gastrointestinal: Negative for abdominal pain and blood in stool.  Genitourinary: Negative for dysuria, frequency and hematuria.  Musculoskeletal: Negative for arthralgias, back pain and neck pain.  Skin: Negative.   Neurological: Negative for dizziness, seizures and speech difficulty.  Psychiatric/Behavioral: Negative for confusion and hallucinations.     Physical Exam Updated Vital Signs BP (!) 142/84 (BP Location: Right Arm)   Pulse 83   Temp 97.9 F (36.6 C) (Oral)   Resp 16   Wt 68 kg (150 lb)   SpO2 99%   BMI 20.34 kg/m   Physical Exam  Constitutional: He is oriented to person, place, and time. He appears well-developed and well-nourished.  Non-toxic appearance.  HENT:  Head: Normocephalic.  Right Ear:  Tympanic membrane and external ear normal.  Left Ear: Tympanic membrane and external ear normal.  Mouth/Throat: Oropharynx is clear and moist. No trismus in the jaw. Abnormal dentition. Dental caries present. No dental abscesses or uvula swelling.    No swelling under tongue. Speech is clear and understandable.  Left upper molar decayed to the gumline.  Eyes: Pupils are equal, round, and reactive to light. EOM and lids are normal.  Neck: Normal range of motion. Neck supple. Carotid bruit is not present.  Cardiovascular: Normal rate, regular rhythm, normal heart sounds, intact distal pulses and normal pulses.  Pulmonary/Chest: Breath sounds normal. No respiratory distress.  Abdominal: Soft. Bowel sounds are  normal. There is no tenderness. There is no guarding.  Musculoskeletal: Normal range of motion.  Lymphadenopathy:       Head (right side): No submandibular adenopathy present.       Head (left side): No submandibular adenopathy present.    He has no cervical adenopathy.  Neurological: He is alert and oriented to person, place, and time. He has normal strength. No cranial nerve deficit or sensory deficit.  Skin: Skin is warm and dry.  Psychiatric: He has a normal mood and affect. His speech is normal.  Nursing note and vitals reviewed.    ED Treatments / Results  Labs (all labs ordered are listed, but only abnormal results are displayed) Labs Reviewed - No data to display  EKG None  Radiology No results found.  Procedures Procedures (including critical care time)  Medications Ordered in ED Medications - No data to display   Initial Impression / Assessment and Plan / ED Course  I have reviewed the triage vital signs and the nursing notes.  Pertinent labs & imaging results that were available during my care of the patient were reviewed by me and considered in my medical decision making (see chart for details).       Final Clinical Impressions(s) / ED Diagnoses MDM  Vital signs are within normal limits.  Pulse oximetry is 99% on room air.  The patient has multiple dental caries noted.  There is some swelling of the gum of the left upper molar area, but no abscess noted.  The airway is patent.  There is no evidence for Ludewig's angina, abscess, or other acute issues.  The patient will be treated with clindamycin and Ultram and ibuprofen.  The patient states he has a dentist that he will call on tomorrow.   Final diagnoses:  Dental caries  Pain, dental    ED Discharge Orders        Ordered    clindamycin (CLEOCIN) 150 MG capsule     12/01/17 1729    ibuprofen (ADVIL,MOTRIN) 600 MG tablet  4 times daily     12/01/17 1729    traMADol (ULTRAM) 50 MG tablet      12/01/17 1729       Ivery QualeBryant, Kristoffer Bala, PA-C 12/01/17 1737    Samuel JesterMcManus, Kathleen, DO 12/01/17 [phone]

## 2018-09-11 ENCOUNTER — Emergency Department (HOSPITAL_COMMUNITY)
Admission: EM | Admit: 2018-09-11 | Discharge: 2018-09-11 | Disposition: A | Discharge status: Home / Self Care | Payer: BLUE CROSS/BLUE SHIELD | Attending: Emergency Medicine | Admitting: Emergency Medicine

## 2018-09-11 ENCOUNTER — Other Ambulatory Visit: Payer: Self-pay

## 2018-09-11 ENCOUNTER — Encounter (HOSPITAL_COMMUNITY): Payer: Self-pay | Admitting: Emergency Medicine

## 2018-09-11 DIAGNOSIS — K0889 Other specified disorders of teeth and supporting structures: Secondary | ICD-10-CM

## 2018-09-11 DIAGNOSIS — F17210 Nicotine dependence, cigarettes, uncomplicated: Secondary | ICD-10-CM | POA: Diagnosis not present

## 2018-09-11 DIAGNOSIS — K029 Dental caries, unspecified: Secondary | ICD-10-CM | POA: Diagnosis not present

## 2018-09-11 MED ORDER — OXYCODONE-ACETAMINOPHEN 5-325 MG PO TABS
1.0000 | ORAL_TABLET | Freq: Once | ORAL | Status: AC
  Administered 2018-09-11: 1 via ORAL
  Filled 2018-09-11: qty 1

## 2018-09-11 MED ORDER — PENICILLIN V POTASSIUM 250 MG PO TABS
500.0000 mg | ORAL_TABLET | Freq: Once | ORAL | Status: AC
  Administered 2018-09-11: 500 mg via ORAL
  Filled 2018-09-11: qty 2

## 2018-09-11 MED ORDER — IBUPROFEN 600 MG PO TABS: 600.0000 mg | ORAL_TABLET | Freq: Four times a day (QID) | ORAL | 0 refills | Status: AC

## 2018-09-11 MED ORDER — BUPIVACAINE-EPINEPHRINE (PF) 0.5% -1:200000 IJ SOLN
INTRAMUSCULAR | Status: AC
  Filled 2018-09-11: qty 1.8

## 2018-09-11 MED ORDER — PENICILLIN V POTASSIUM 500 MG PO TABS: 500.0000 mg | ORAL_TABLET | Freq: Four times a day (QID) | ORAL | 0 refills | Status: AC

## 2018-09-11 MED ORDER — BUPIVACAINE HCL (PF) 0.25 % IJ SOLN
2.0000 mL | Freq: Once | INTRAMUSCULAR | Status: AC
  Administered 2018-09-11: 2 mL

## 2018-09-11 MED ORDER — KETOROLAC TROMETHAMINE 60 MG/2ML IM SOLN
60.0000 mg | Freq: Once | INTRAMUSCULAR | Status: AC
  Administered 2018-09-11: 60 mg via INTRAMUSCULAR
  Filled 2018-09-11: qty 2

## 2018-09-11 NOTE — ED Triage Notes (Signed)
Pt c/o dental pain to upper left jaw tooth x a few days, has not seen dentist

## 2018-09-12 NOTE — ED Provider Notes (Signed)
Emergency Department Provider Note   I have reviewed the triage vital signs and the nursing notes.   HISTORY  Chief Complaint Dental Pain   HPI Samuel Powell is a 38 y.o. male with history of dental caries and tooth infection status post multiple extractions presents the emergency department today secondary to pain with his left most posterior remaining tooth for the last 2 to 3 days is been progressively worsening with some mild swelling.  No difficulty swallowing or breathing.  No fevers. No other associated or modifying symptoms.    Past Medical History:  Diagnosis Date  . Dental caries   . Kidney stones     There are no active problems to display for this patient.   History reviewed. No pertinent surgical history.  Current Outpatient Rx  . Order #: [id number] Class: Historical Med  . Order #: [id number] Class: Print  . Order #: [id number] Class: Print    Allergies Ultram [tramadol]  History reviewed. No pertinent family history.  Social History Social History   Tobacco Use  . Smoking status: Current Every Day Smoker    Packs/day: 1.00    Types: Cigarettes  . Smokeless tobacco: Never Used  Substance Use Topics  . Alcohol use: No  . Drug use: No    Review of Systems  All other systems negative except as documented in the HPI. All pertinent positives and negatives as reviewed in the HPI. ____________________________________________   PHYSICAL EXAM:  VITAL SIGNS: ED Triage Vitals [09/11/18 2044]  Enc Vitals Group     BP (!) 153/96     Pulse Rate 71     Resp 18     Temp 97.7 F (36.5 C)     Temp Source Oral     SpO2 98 %     Weight 143 lb (64.9 kg)     Height 6' (1.829 m)    Constitutional: Alert and oriented. Well appearing and in no acute distress. Eyes: Conjunctivae are normal. PERRL. EOMI. Head: Atraumatic. Nose: No congestion/rhinnorhea. Mouth/Throat: Mucous membranes are moist.  Oropharynx non-erythematous.  Significant dental caries  including the tooth that hurts.  Mild swelling around it with no obvious abscess. Neck: No stridor.  No meningeal signs.   Cardiovascular: Normal rate, regular rhythm. Good peripheral circulation. Grossly normal heart sounds.   Respiratory: Normal respiratory effort.  No retractions. Lungs CTAB. Gastrointestinal: Soft and nontender. No distention.  Musculoskeletal: No lower extremity tenderness nor edema. No gross deformities of extremities. Neurologic:  Normal speech and language. No gross focal neurologic deficits are appreciated.  Skin:  Skin is warm, dry and intact. No rash noted.   ____________________________________________   PROCEDURES  Procedure(s) performed:   Dental Block Date/Time: 09/12/2018 10:45 PM Performed by: Marily MemosMesner, Ladarryl Wrage, MD Authorized by: Marily MemosMesner, Deshae Dickison, MD   Consent:    Consent obtained:  Verbal   Consent given by:  Patient   Risks discussed:  Allergic reaction, infection, nerve damage, swelling, unsuccessful block, pain and intravascular injection   Alternatives discussed:  No treatment Indications:    Indications: dental pain   Location:    Block type:  Supraperiosteal Procedure details (see MAR for exact dosages):    Topical anesthetic:  Tetracaine gel   Syringe type:  Aspirating dental syringe   Needle gauge:  24 G   Anesthetic injected:  Bupivacaine 0.5% w/o epi   Injection procedure:  Anatomic landmarks identified Post-procedure details:    Patient tolerance of procedure:  Tolerated well, no immediate complications  ____________________________________________   INITIAL IMPRESSION / ASSESSMENT AND PLAN / ED COURSE  No obvious complications or evidence of airway compromise.  Pain relief achieved with dental block as above.  Will follow-up with dentist.  NSAIDs and penicillin at home.     Pertinent labs & imaging results that were available during my care of the patient were reviewed by me and considered in my medical decision making (see  chart for details).  ____________________________________________  FINAL CLINICAL IMPRESSION(S) / ED DIAGNOSES  Final diagnoses:  Dental caries  Pain, dental     MEDICATIONS GIVEN DURING THIS VISIT:  Medications  bupivacaine (PF) (MARCAINE) 0.25 % injection 2 mL (2 mLs Infiltration Given 09/11/18 2307)  ketorolac (TORADOL) injection 60 mg (60 mg Intramuscular Given 09/11/18 2317)  oxyCODONE-acetaminophen (PERCOCET/ROXICET) 5-325 MG per tablet 1 tablet (1 tablet Oral Given 09/11/18 2316)  penicillin v potassium (VEETID) tablet 500 mg (500 mg Oral Given 09/11/18 2316)     NEW OUTPATIENT MEDICATIONS STARTED DURING THIS VISIT:  Discharge Medication List as of 09/11/2018 11:39 PM    START taking these medications   Details  penicillin v potassium (VEETID) 500 MG tablet Take 1 tablet (500 mg total) by mouth 4 (four) times daily for 10 days., Starting Thu 09/11/2018, Until Sun 09/21/2018, Print        Note:  This note was prepared with assistance of Dragon voice recognition software. Occasional wrong-word or sound-a-like substitutions may have occurred due to the inherent limitations of voice recognition software.   Marily Memos, MD 09/12/18 [phone]

## 2019-02-07 IMAGING — DX DG ANKLE COMPLETE 3+V*L*
3 series · 3 of 3 positions shown · non-contrast
Comparison: None.

CLINICAL DATA: Left ankle pain after injury. Rolled ankle in a hole
in the yard prior to arrival.

EXAM:
LEFT ANKLE COMPLETE - 3+ VIEW

[ankle ap]
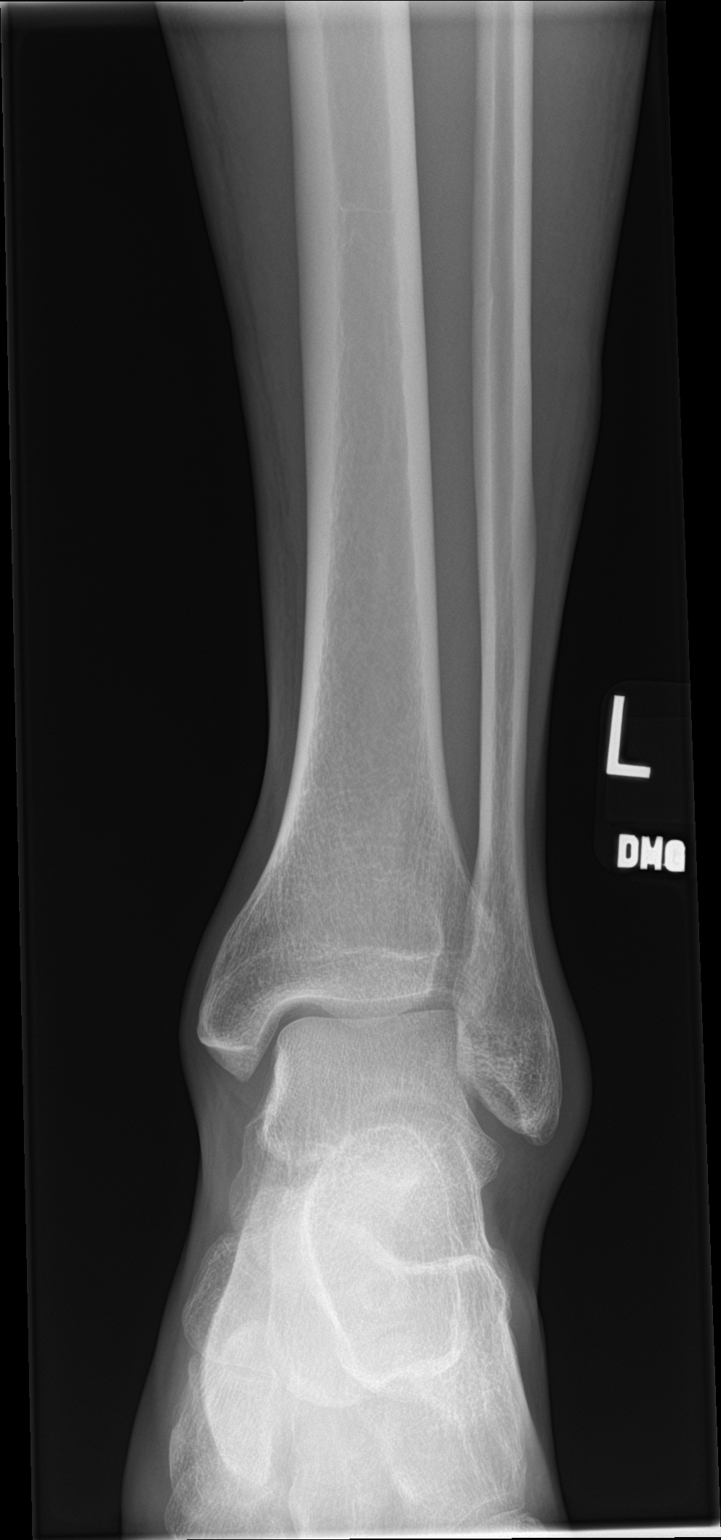

[ankle obl]
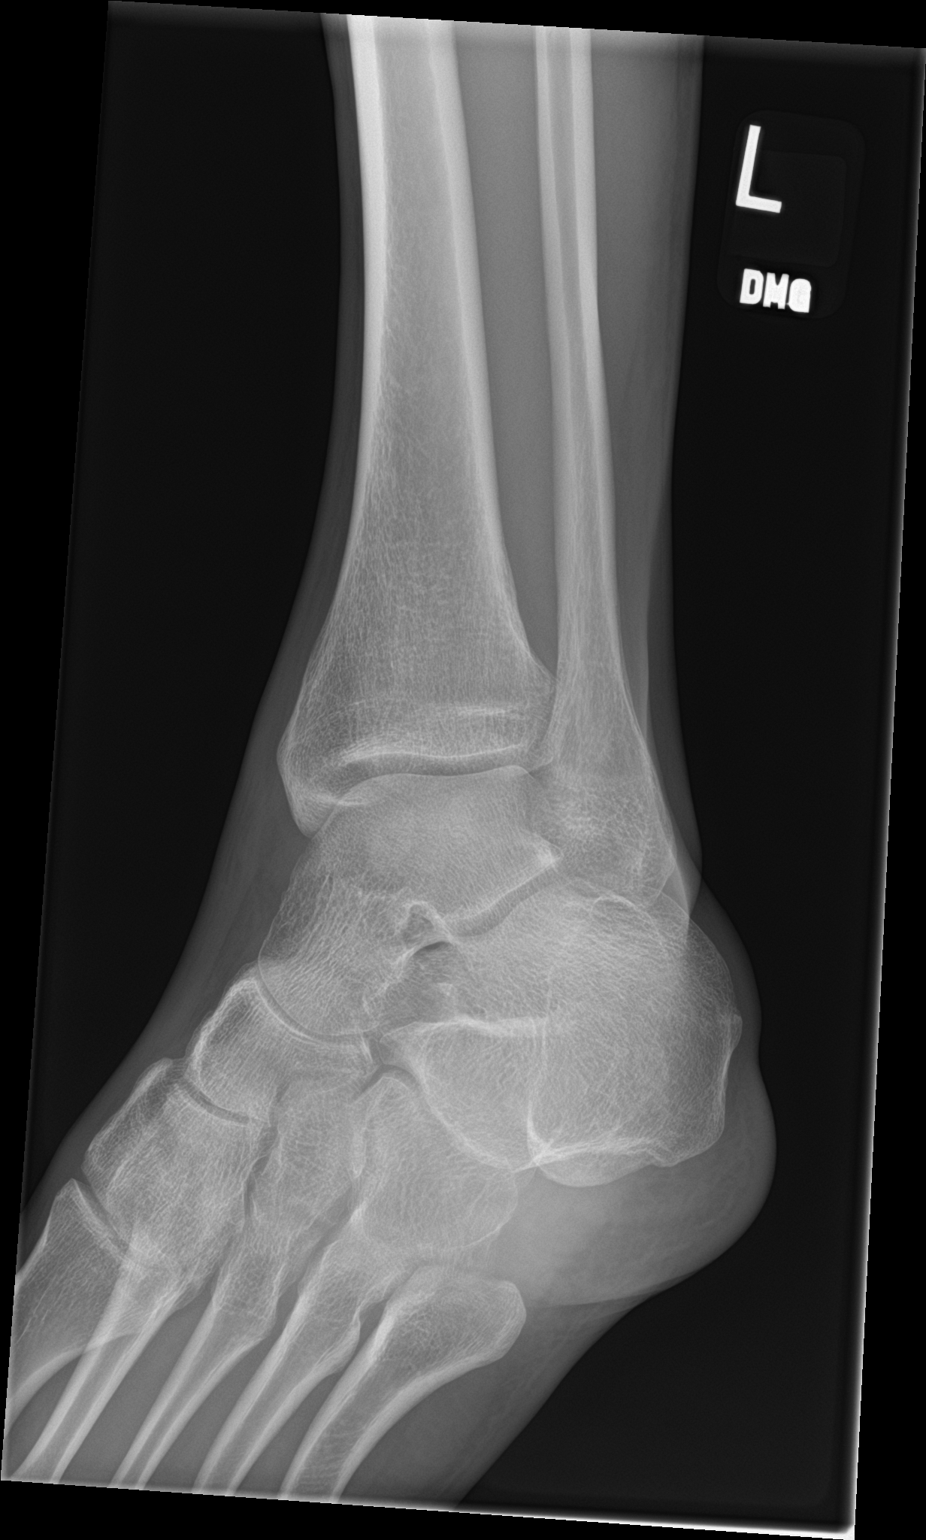

[ankle lat]
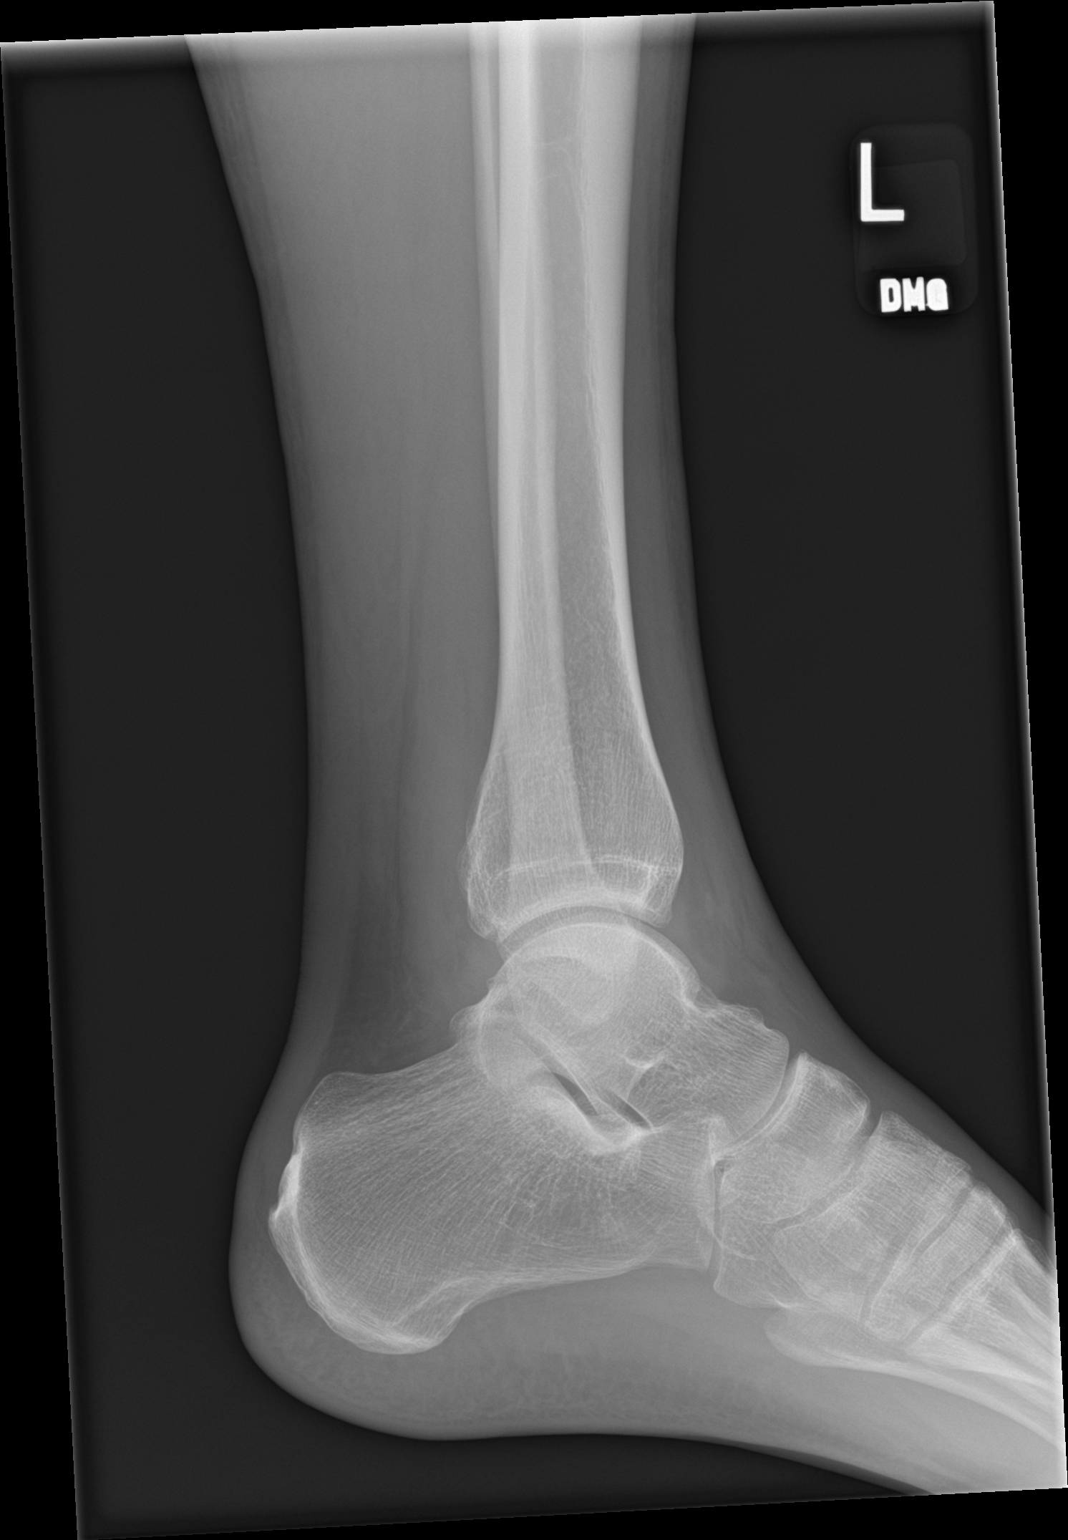

[3 of 3 positions shown; findings below may reference images not displayed]

FINDINGS: There is no evidence of fracture or dislocation. There is no
evidence of arthropathy or other focal bone abnormality. Small
tibial talar joint effusion. Soft tissues are unremarkable.
IMPRESSION: Small tibiotalar joint effusion.  No fracture or dislocation.

## 2020-09-19 ENCOUNTER — Ambulatory Visit
Admission: EM | Admit: 2020-09-19 | Discharge: 2020-09-19 | Disposition: A | Discharge status: Home / Self Care | Payer: BLUE CROSS/BLUE SHIELD | Attending: Emergency Medicine | Admitting: Emergency Medicine

## 2020-09-19 DIAGNOSIS — R1032 Left lower quadrant pain: Secondary | ICD-10-CM

## 2020-09-19 DIAGNOSIS — M545 Low back pain, unspecified: Secondary | ICD-10-CM

## 2020-09-19 DIAGNOSIS — N50812 Left testicular pain: Secondary | ICD-10-CM

## 2020-09-19 LAB — POCT URINALYSIS DIP (MANUAL ENTRY)
Bilirubin, UA: NEGATIVE
Glucose, UA: NEGATIVE mg/dL
Ketones, POC UA: NEGATIVE mg/dL
Leukocytes, UA: NEGATIVE
Nitrite, UA: NEGATIVE
Protein Ur, POC: NEGATIVE mg/dL
Spec Grav, UA: 1.015 (ref 1.010–1.025)
Urobilinogen, UA: 0.2 E.U./dL
pH, UA: 6.5 (ref 5.0–8.0)

## 2020-09-19 NOTE — Discharge Instructions (Signed)
Please go to ER for further evaluation.

## 2020-09-19 NOTE — ED Triage Notes (Signed)
Patient presents to Urgent Care with complaints of left lower back pain and  lower abdominal pain since Friday. Pt states he has a hx of Kidney stones, however concerned since he has noted hematuria, dysuria, and low grade temp. Treating pain with Advil with no relief.

## 2020-09-19 NOTE — ED Provider Notes (Addendum)
[facility]   [id number] 09/19/20 Arrival Time: 0845   Chief Complaint  Patient presents with  . Abdominal Pain     SUBJECTIVE: History from: patient.  Samuel Powell is a 40 y.o. male with history of kidney stone  presented to the urgent care with a complaint of left lower abdominal pain, left lower back pain and left testicular pain and swelling that started this past Friday.  Denies any precipitating event, or any new sexual encounter.  State he is not sexually active for the past 8 years.  Has tried OTC medication without relief.  Denies alleviating factors.  Denies previous symptoms in the past.   Denies fever, chills, fatigue, SOB, chest pain, nausea, difficulty urinating, penile discharge, changes in bowel or bladder habits.     ROS: As per HPI.  All other pertinent ROS negative.      Past Medical History:  Diagnosis Date  . Dental caries   . Kidney stones    History reviewed. No pertinent surgical history. Allergies  Allergen Reactions  . Ultram [Tramadol] Anxiety   No current facility-administered medications on file prior to encounter.   Current Outpatient Medications on File Prior to Encounter  Medication Sig Dispense Refill  . Buprenorphine HCl-Naloxone HCl 8-2 MG FILM Place 1 Film under the tongue 2 (two) times daily.    Marland Kitchen ibuprofen (ADVIL,MOTRIN) 600 MG tablet Take 1 tablet (600 mg total) by mouth 4 (four) times daily. 30 tablet 0   Social History   Socioeconomic History  . Marital status: Single    Spouse name: Not on file  . Number of children: Not on file  . Years of education: Not on file  . Highest education level: Not on file  Occupational History  . Not on file  Tobacco Use  . Smoking status: Current Every Day Smoker    Packs/day: 1.00    Types: Cigarettes  . Smokeless tobacco: Never Used  Vaping Use  . Vaping Use: Never used  Substance and Sexual Activity  . Alcohol use: No  . Drug use: No  . Sexual activity: Not on file  Other  Topics Concern  . Not on file  Social History Narrative  . Not on file   Social Determinants of Health   Financial Resource Strain: Not on file  Food Insecurity: Not on file  Transportation Needs: Not on file  Physical Activity: Not on file  Stress: Not on file  Social Connections: Not on file  Intimate Partner Violence: Not on file   Family History  Problem Relation Age of Onset  . Healthy Mother   . Healthy Father     OBJECTIVE:  Vitals:   09/19/20 0908  BP: (!) 156/82  Pulse: 73  Resp: 18  Temp: 97.9 F (36.6 C)  TempSrc: Oral  SpO2: 96%     Physical Exam Vitals and nursing note reviewed. Exam conducted with a chaperone present.  Constitutional:      General: He is not in acute distress.    Appearance: Normal appearance. He is normal weight. He is not ill-appearing, toxic-appearing or diaphoretic.  Cardiovascular:     Rate and Rhythm: Normal rate and regular rhythm.     Pulses: Normal pulses.     Heart sounds: Normal heart sounds. No murmur heard. No friction rub. No gallop.   Pulmonary:     Effort: Pulmonary effort is normal. No respiratory distress.     Breath sounds: Normal breath sounds. No stridor. No wheezing, rhonchi  or rales.  Chest:     Chest wall: No tenderness.  Abdominal:     Tenderness: There is abdominal tenderness in the left lower quadrant. There is left CVA tenderness. There is no right CVA tenderness.  Genitourinary:    Pubic Area: No rash.      Penis: Circumcised. No tenderness, discharge, swelling or lesions.      Testes:        Left: Tenderness and swelling present.  Neurological:     Mental Status: He is alert and oriented to person, place, and time.     LABS:  Results for orders placed or performed during the hospital encounter of 09/19/20 (from the past 24 hour(s))  POCT urinalysis dipstick     Status: Abnormal   Collection Time: 09/19/20  9:43 AM  Result Value Ref Range   Color, UA yellow yellow   Clarity, UA hazy (A)  clear   Glucose, UA negative negative mg/dL   Bilirubin, UA negative negative   Ketones, POC UA negative negative mg/dL   Spec Grav, UA 1.914 7.829 - 1.025   Blood, UA trace-intact (A) negative   pH, UA 6.5 5.0 - 8.0   Protein Ur, POC negative negative mg/dL   Urobilinogen, UA 0.2 0.2 or 1.0 E.U./dL   Nitrite, UA Negative Negative   Leukocytes, UA Negative Negative     ASSESSMENT & PLAN:  1. Pain in left testicle   2. Left lower quadrant abdominal pain   3. Acute left-sided low back pain without sciatica     No orders of the defined types were placed in this encounter.  Patient is stable at discharge.  He was advised to go to ER for further evaluation to rule out other testicular abnormalities.  Discharge instructions  Please go to ER for further evaluation  Reviewed expectations re: course of current medical issues. Questions answered. Outlined signs and symptoms indicating need for more acute intervention. Patient verbalized understanding. After Visit Summary given.         Durward Parcel, FNP 09/19/20 1005    Durward Parcel, FNP 09/19/20 1006

## 2021-02-03 ENCOUNTER — Emergency Department (HOSPITAL_COMMUNITY): Admission: EM | Admit: 2021-02-03 | Discharge: 2021-02-03 | Discharge status: Against Medical Advice | Payer: 59

## 2021-02-03 ENCOUNTER — Other Ambulatory Visit: Payer: Self-pay

## 2021-03-19 ENCOUNTER — Encounter (HOSPITAL_COMMUNITY): Payer: Self-pay

## 2021-03-19 ENCOUNTER — Other Ambulatory Visit: Payer: Self-pay

## 2021-03-19 ENCOUNTER — Emergency Department (HOSPITAL_COMMUNITY)
Admission: EM | Admit: 2021-03-19 | Discharge: 2021-03-19 | Disposition: A | Discharge status: Home / Self Care | Payer: 59 | Attending: Emergency Medicine | Admitting: Emergency Medicine

## 2021-03-19 DIAGNOSIS — F17210 Nicotine dependence, cigarettes, uncomplicated: Secondary | ICD-10-CM | POA: Diagnosis not present

## 2021-03-19 DIAGNOSIS — K0889 Other specified disorders of teeth and supporting structures: Secondary | ICD-10-CM | POA: Diagnosis not present

## 2021-03-19 MED ORDER — MELOXICAM 7.5 MG PO TABS: 7.5000 mg | ORAL_TABLET | Freq: Two times a day (BID) | ORAL | 0 refills | Status: AC | PRN

## 2021-03-19 NOTE — ED Provider Notes (Signed)
[facility] EMERGENCY DEPARTMENT Provider Note   CSN: [id number] Arrival date & time: 03/19/21  2440     History Chief Complaint  Patient presents with   Dental Pain    Samuel Powell is a 40 y.o. male.   Dental Pain Associated symptoms: no fever     40 y/o male - had teeth pulled on Friday - 2 days ago - on the left upper jaw - this was b/c of dental pain and severe degradation - has no fevers - but pain has been persistent - no swelling of the jaw - no drainage.  Ibuprofen and amox without relief - not getting worse, better with cold water.  Past Medical History:  Diagnosis Date   Dental caries    Kidney stones     There are no problems to display for this patient.   History reviewed. No pertinent surgical history.     Family History  Problem Relation Age of Onset   Healthy Mother    Healthy Father     Social History   Tobacco Use   Smoking status: Every Day    Packs/day: 1.00    Types: Cigarettes   Smokeless tobacco: Never  Vaping Use   Vaping Use: Never used  Substance Use Topics   Alcohol use: No   Drug use: No    Home Medications Prior to Admission medications   Medication Sig Start Date End Date Taking? Authorizing Provider  meloxicam (MOBIC) 7.5 MG tablet Take 1 tablet (7.5 mg total) by mouth 2 (two) times daily as needed for up to 14 days for pain. 03/19/21 04/02/21 Yes Eber Hong, MD  Buprenorphine HCl-Naloxone HCl 8-2 MG FILM Place 1 Film under the tongue 2 (two) times daily.    [provider]  ibuprofen (ADVIL,MOTRIN) 600 MG tablet Take 1 tablet (600 mg total) by mouth 4 (four) times daily. 09/11/18   Mesner, Barbara Cower, MD    Allergies    Ultram [tramadol]  Review of Systems   Review of Systems  Constitutional:  Negative for fever.  HENT:  Positive for dental problem.    Physical Exam Updated Vital Signs BP (!) 155/108 (BP Location: Right Arm)   Pulse 81   Temp 97.9 F (36.6 C) (Oral)   Resp 18   Ht 1.829 m (6')   Wt 63.5  kg   SpO2 98%   BMI 18.99 kg/m   Physical Exam Vitals and nursing note reviewed.  Constitutional:      Appearance: He is well-developed. He is not diaphoretic.  HENT:     Head: Normocephalic and atraumatic.     Mouth/Throat:     Comments: Several bad tteth - no swelling of hte jaw / face - no LAD, no trismums / torticollis -no foul odor, no signs of purulent gum disease, no swelling of the face Eyes:     General:        Right eye: No discharge.        Left eye: No discharge.     Conjunctiva/sclera: Conjunctivae normal.  Pulmonary:     Effort: Pulmonary effort is normal. No respiratory distress.  Skin:    General: Skin is warm and dry.     Findings: No erythema or rash.  Neurological:     Mental Status: He is alert.     Coordination: Coordination normal.    ED Results / Procedures / Treatments   Labs (all labs ordered are listed, but only abnormal results are displayed) Labs Reviewed -  No data to display  EKG None  Radiology No results found.  Procedures Procedures   Medications Ordered in ED Medications - No data to display  ED Course  I have reviewed the triage vital signs and the nursing notes.  Pertinent labs & imaging results that were available during my care of the patient were reviewed by me and considered in my medical decision making (see chart for details).    MDM Rules/Calculators/A&P                           The patient has no obvious swelling around the gums, there is no signs of infection, he may very well have dry socket, he can continue amoxicillin, prescription anti-inflammatory given, can follow-up with dentistry  Final Clinical Impression(s) / ED Diagnoses Final diagnoses:  Pain, dental    Rx / DC Orders ED Discharge Orders          Ordered    meloxicam (MOBIC) 7.5 MG tablet  2 times daily PRN        03/19/21 0715             Eber Hong, MD 03/19/21 1140

## 2021-03-19 NOTE — Discharge Instructions (Addendum)
See your dentist in 2 days for recheck  Follow up with [facility] - Dr. Gilford Silvius may follow up with Dr. Sonda Rumble: [phone]  [address] [place], [place] [address]

## 2021-03-19 NOTE — ED Triage Notes (Signed)
Pt presents to ED with complaints of upper dental pain on left side. Pt had 2 teeth pulled a couple days ago. Dr Hyacinth Meeker in triage to see pt.

## 2021-10-23 DIAGNOSIS — R69 Illness, unspecified: Secondary | ICD-10-CM | POA: Diagnosis not present

## 2021-10-23 DIAGNOSIS — B349 Viral infection, unspecified: Secondary | ICD-10-CM | POA: Diagnosis not present

## 2021-10-23 DIAGNOSIS — G4700 Insomnia, unspecified: Secondary | ICD-10-CM | POA: Diagnosis not present

## 2021-10-24 DIAGNOSIS — G4700 Insomnia, unspecified: Secondary | ICD-10-CM | POA: Diagnosis not present

## 2021-10-24 DIAGNOSIS — F419 Anxiety disorder, unspecified: Secondary | ICD-10-CM | POA: Diagnosis not present

## 2021-10-24 DIAGNOSIS — R69 Illness, unspecified: Secondary | ICD-10-CM | POA: Diagnosis not present

## 2021-11-06 DIAGNOSIS — B349 Viral infection, unspecified: Secondary | ICD-10-CM | POA: Diagnosis not present

## 2021-11-06 DIAGNOSIS — R69 Illness, unspecified: Secondary | ICD-10-CM | POA: Diagnosis not present

## 2021-11-06 DIAGNOSIS — G4700 Insomnia, unspecified: Secondary | ICD-10-CM | POA: Diagnosis not present

## 2021-11-20 DIAGNOSIS — G4700 Insomnia, unspecified: Secondary | ICD-10-CM | POA: Diagnosis not present

## 2021-11-20 DIAGNOSIS — F1290 Cannabis use, unspecified, uncomplicated: Secondary | ICD-10-CM | POA: Diagnosis not present

## 2021-11-20 DIAGNOSIS — R69 Illness, unspecified: Secondary | ICD-10-CM | POA: Diagnosis not present

## 2021-11-21 DIAGNOSIS — F1290 Cannabis use, unspecified, uncomplicated: Secondary | ICD-10-CM | POA: Diagnosis not present

## 2021-11-21 DIAGNOSIS — F419 Anxiety disorder, unspecified: Secondary | ICD-10-CM | POA: Diagnosis not present

## 2021-11-21 DIAGNOSIS — R69 Illness, unspecified: Secondary | ICD-10-CM | POA: Diagnosis not present

## 2021-12-04 DIAGNOSIS — G4700 Insomnia, unspecified: Secondary | ICD-10-CM | POA: Diagnosis not present

## 2021-12-04 DIAGNOSIS — B349 Viral infection, unspecified: Secondary | ICD-10-CM | POA: Diagnosis not present

## 2021-12-04 DIAGNOSIS — R69 Illness, unspecified: Secondary | ICD-10-CM | POA: Diagnosis not present

## 2021-12-18 DIAGNOSIS — F1290 Cannabis use, unspecified, uncomplicated: Secondary | ICD-10-CM | POA: Diagnosis not present

## 2021-12-18 DIAGNOSIS — G4700 Insomnia, unspecified: Secondary | ICD-10-CM | POA: Diagnosis not present

## 2021-12-18 DIAGNOSIS — R69 Illness, unspecified: Secondary | ICD-10-CM | POA: Diagnosis not present

## 2021-12-19 DIAGNOSIS — F419 Anxiety disorder, unspecified: Secondary | ICD-10-CM | POA: Diagnosis not present

## 2021-12-19 DIAGNOSIS — R69 Illness, unspecified: Secondary | ICD-10-CM | POA: Diagnosis not present

## 2021-12-19 DIAGNOSIS — F1290 Cannabis use, unspecified, uncomplicated: Secondary | ICD-10-CM | POA: Diagnosis not present

## 2021-12-26 DIAGNOSIS — F419 Anxiety disorder, unspecified: Secondary | ICD-10-CM | POA: Diagnosis not present

## 2021-12-26 DIAGNOSIS — R69 Illness, unspecified: Secondary | ICD-10-CM | POA: Diagnosis not present

## 2021-12-26 DIAGNOSIS — F1290 Cannabis use, unspecified, uncomplicated: Secondary | ICD-10-CM | POA: Diagnosis not present

## 2022-01-01 DIAGNOSIS — R69 Illness, unspecified: Secondary | ICD-10-CM | POA: Diagnosis not present

## 2022-01-01 DIAGNOSIS — G4700 Insomnia, unspecified: Secondary | ICD-10-CM | POA: Diagnosis not present

## 2022-01-01 DIAGNOSIS — B349 Viral infection, unspecified: Secondary | ICD-10-CM | POA: Diagnosis not present

## 2022-01-09 DIAGNOSIS — F419 Anxiety disorder, unspecified: Secondary | ICD-10-CM | POA: Diagnosis not present

## 2022-01-09 DIAGNOSIS — F1290 Cannabis use, unspecified, uncomplicated: Secondary | ICD-10-CM | POA: Diagnosis not present

## 2022-01-09 DIAGNOSIS — R69 Illness, unspecified: Secondary | ICD-10-CM | POA: Diagnosis not present

## 2022-01-15 DIAGNOSIS — R69 Illness, unspecified: Secondary | ICD-10-CM | POA: Diagnosis not present

## 2022-01-15 DIAGNOSIS — G4700 Insomnia, unspecified: Secondary | ICD-10-CM | POA: Diagnosis not present

## 2022-01-15 DIAGNOSIS — B349 Viral infection, unspecified: Secondary | ICD-10-CM | POA: Diagnosis not present

## 2022-01-22 DIAGNOSIS — G4700 Insomnia, unspecified: Secondary | ICD-10-CM | POA: Diagnosis not present

## 2022-01-22 DIAGNOSIS — B349 Viral infection, unspecified: Secondary | ICD-10-CM | POA: Diagnosis not present

## 2022-01-22 DIAGNOSIS — R69 Illness, unspecified: Secondary | ICD-10-CM | POA: Diagnosis not present

## 2022-02-12 DIAGNOSIS — F1290 Cannabis use, unspecified, uncomplicated: Secondary | ICD-10-CM | POA: Diagnosis not present

## 2022-02-12 DIAGNOSIS — R69 Illness, unspecified: Secondary | ICD-10-CM | POA: Diagnosis not present

## 2022-02-13 DIAGNOSIS — R69 Illness, unspecified: Secondary | ICD-10-CM | POA: Diagnosis not present

## 2022-02-13 DIAGNOSIS — F419 Anxiety disorder, unspecified: Secondary | ICD-10-CM | POA: Diagnosis not present

## 2022-02-13 DIAGNOSIS — F1290 Cannabis use, unspecified, uncomplicated: Secondary | ICD-10-CM | POA: Diagnosis not present

## 2022-02-26 DIAGNOSIS — R69 Illness, unspecified: Secondary | ICD-10-CM | POA: Diagnosis not present

## 2022-02-26 DIAGNOSIS — F1290 Cannabis use, unspecified, uncomplicated: Secondary | ICD-10-CM | POA: Diagnosis not present

## 2022-03-12 DIAGNOSIS — R69 Illness, unspecified: Secondary | ICD-10-CM | POA: Diagnosis not present

## 2022-03-12 DIAGNOSIS — F1290 Cannabis use, unspecified, uncomplicated: Secondary | ICD-10-CM | POA: Diagnosis not present

## 2022-03-26 DIAGNOSIS — R69 Illness, unspecified: Secondary | ICD-10-CM | POA: Diagnosis not present

## 2022-03-26 DIAGNOSIS — F1290 Cannabis use, unspecified, uncomplicated: Secondary | ICD-10-CM | POA: Diagnosis not present

## 2022-04-06 DIAGNOSIS — F419 Anxiety disorder, unspecified: Secondary | ICD-10-CM | POA: Diagnosis not present

## 2022-04-06 DIAGNOSIS — F1290 Cannabis use, unspecified, uncomplicated: Secondary | ICD-10-CM | POA: Diagnosis not present

## 2022-04-06 DIAGNOSIS — R69 Illness, unspecified: Secondary | ICD-10-CM | POA: Diagnosis not present

## 2022-04-09 DIAGNOSIS — F1290 Cannabis use, unspecified, uncomplicated: Secondary | ICD-10-CM | POA: Diagnosis not present

## 2022-04-09 DIAGNOSIS — R69 Illness, unspecified: Secondary | ICD-10-CM | POA: Diagnosis not present

## 2022-04-23 DIAGNOSIS — R69 Illness, unspecified: Secondary | ICD-10-CM | POA: Diagnosis not present

## 2022-04-23 DIAGNOSIS — F1290 Cannabis use, unspecified, uncomplicated: Secondary | ICD-10-CM | POA: Diagnosis not present

## 2022-05-07 DIAGNOSIS — F1290 Cannabis use, unspecified, uncomplicated: Secondary | ICD-10-CM | POA: Diagnosis not present

## 2022-05-07 DIAGNOSIS — R69 Illness, unspecified: Secondary | ICD-10-CM | POA: Diagnosis not present

## 2022-05-21 DIAGNOSIS — R69 Illness, unspecified: Secondary | ICD-10-CM | POA: Diagnosis not present

## 2022-05-21 DIAGNOSIS — F1290 Cannabis use, unspecified, uncomplicated: Secondary | ICD-10-CM | POA: Diagnosis not present

## 2022-05-29 DIAGNOSIS — F1290 Cannabis use, unspecified, uncomplicated: Secondary | ICD-10-CM | POA: Diagnosis not present

## 2022-05-29 DIAGNOSIS — R69 Illness, unspecified: Secondary | ICD-10-CM | POA: Diagnosis not present

## 2022-05-29 DIAGNOSIS — F419 Anxiety disorder, unspecified: Secondary | ICD-10-CM | POA: Diagnosis not present

## 2022-06-04 DIAGNOSIS — F1290 Cannabis use, unspecified, uncomplicated: Secondary | ICD-10-CM | POA: Diagnosis not present

## 2022-06-04 DIAGNOSIS — R69 Illness, unspecified: Secondary | ICD-10-CM | POA: Diagnosis not present

## 2022-06-13 DIAGNOSIS — F1290 Cannabis use, unspecified, uncomplicated: Secondary | ICD-10-CM | POA: Diagnosis not present

## 2022-06-13 DIAGNOSIS — R69 Illness, unspecified: Secondary | ICD-10-CM | POA: Diagnosis not present

## 2022-06-13 DIAGNOSIS — F419 Anxiety disorder, unspecified: Secondary | ICD-10-CM | POA: Diagnosis not present

## 2022-06-18 DIAGNOSIS — F1290 Cannabis use, unspecified, uncomplicated: Secondary | ICD-10-CM | POA: Diagnosis not present

## 2022-06-18 DIAGNOSIS — R69 Illness, unspecified: Secondary | ICD-10-CM | POA: Diagnosis not present

## 2022-06-27 DIAGNOSIS — F419 Anxiety disorder, unspecified: Secondary | ICD-10-CM | POA: Diagnosis not present

## 2022-06-27 DIAGNOSIS — R69 Illness, unspecified: Secondary | ICD-10-CM | POA: Diagnosis not present

## 2022-06-27 DIAGNOSIS — F1290 Cannabis use, unspecified, uncomplicated: Secondary | ICD-10-CM | POA: Diagnosis not present

## 2022-07-02 DIAGNOSIS — R69 Illness, unspecified: Secondary | ICD-10-CM | POA: Diagnosis not present

## 2022-07-02 DIAGNOSIS — F419 Anxiety disorder, unspecified: Secondary | ICD-10-CM | POA: Diagnosis not present

## 2022-07-02 DIAGNOSIS — F1290 Cannabis use, unspecified, uncomplicated: Secondary | ICD-10-CM | POA: Diagnosis not present

## 2022-07-16 DIAGNOSIS — F1290 Cannabis use, unspecified, uncomplicated: Secondary | ICD-10-CM | POA: Diagnosis not present

## 2022-07-16 DIAGNOSIS — R69 Illness, unspecified: Secondary | ICD-10-CM | POA: Diagnosis not present

## 2022-07-30 DIAGNOSIS — F1290 Cannabis use, unspecified, uncomplicated: Secondary | ICD-10-CM | POA: Diagnosis not present

## 2022-07-30 DIAGNOSIS — R69 Illness, unspecified: Secondary | ICD-10-CM | POA: Diagnosis not present

## 2022-08-08 DIAGNOSIS — F419 Anxiety disorder, unspecified: Secondary | ICD-10-CM | POA: Diagnosis not present

## 2022-08-08 DIAGNOSIS — R69 Illness, unspecified: Secondary | ICD-10-CM | POA: Diagnosis not present

## 2022-08-08 DIAGNOSIS — F1290 Cannabis use, unspecified, uncomplicated: Secondary | ICD-10-CM | POA: Diagnosis not present

## 2022-08-15 DIAGNOSIS — F1290 Cannabis use, unspecified, uncomplicated: Secondary | ICD-10-CM | POA: Diagnosis not present

## 2022-08-15 DIAGNOSIS — R69 Illness, unspecified: Secondary | ICD-10-CM | POA: Diagnosis not present

## 2022-08-27 DIAGNOSIS — F1290 Cannabis use, unspecified, uncomplicated: Secondary | ICD-10-CM | POA: Diagnosis not present

## 2022-08-27 DIAGNOSIS — R69 Illness, unspecified: Secondary | ICD-10-CM | POA: Diagnosis not present

## 2022-08-29 DIAGNOSIS — R69 Illness, unspecified: Secondary | ICD-10-CM | POA: Diagnosis not present

## 2022-08-29 DIAGNOSIS — F419 Anxiety disorder, unspecified: Secondary | ICD-10-CM | POA: Diagnosis not present

## 2022-08-29 DIAGNOSIS — F1290 Cannabis use, unspecified, uncomplicated: Secondary | ICD-10-CM | POA: Diagnosis not present

## 2022-09-11 DIAGNOSIS — R69 Illness, unspecified: Secondary | ICD-10-CM | POA: Diagnosis not present

## 2022-09-11 DIAGNOSIS — F1290 Cannabis use, unspecified, uncomplicated: Secondary | ICD-10-CM | POA: Diagnosis not present

## 2022-09-19 DIAGNOSIS — F1290 Cannabis use, unspecified, uncomplicated: Secondary | ICD-10-CM | POA: Diagnosis not present

## 2022-09-19 DIAGNOSIS — F419 Anxiety disorder, unspecified: Secondary | ICD-10-CM | POA: Diagnosis not present

## 2022-09-19 DIAGNOSIS — F1120 Opioid dependence, uncomplicated: Secondary | ICD-10-CM | POA: Diagnosis not present

## 2022-10-10 DIAGNOSIS — F1120 Opioid dependence, uncomplicated: Secondary | ICD-10-CM | POA: Diagnosis not present

## 2022-10-10 DIAGNOSIS — F1290 Cannabis use, unspecified, uncomplicated: Secondary | ICD-10-CM | POA: Diagnosis not present

## 2022-10-10 DIAGNOSIS — F419 Anxiety disorder, unspecified: Secondary | ICD-10-CM | POA: Diagnosis not present

## 2022-10-22 DIAGNOSIS — F1120 Opioid dependence, uncomplicated: Secondary | ICD-10-CM | POA: Diagnosis not present

## 2022-10-22 DIAGNOSIS — F1290 Cannabis use, unspecified, uncomplicated: Secondary | ICD-10-CM | POA: Diagnosis not present

## 2022-10-22 DIAGNOSIS — F419 Anxiety disorder, unspecified: Secondary | ICD-10-CM | POA: Diagnosis not present

## 2022-11-05 DIAGNOSIS — F1120 Opioid dependence, uncomplicated: Secondary | ICD-10-CM | POA: Diagnosis not present

## 2022-11-06 DIAGNOSIS — F419 Anxiety disorder, unspecified: Secondary | ICD-10-CM | POA: Diagnosis not present

## 2022-11-06 DIAGNOSIS — F1120 Opioid dependence, uncomplicated: Secondary | ICD-10-CM | POA: Diagnosis not present

## 2022-11-06 DIAGNOSIS — F1290 Cannabis use, unspecified, uncomplicated: Secondary | ICD-10-CM | POA: Diagnosis not present

## 2022-11-19 DIAGNOSIS — F419 Anxiety disorder, unspecified: Secondary | ICD-10-CM | POA: Diagnosis not present

## 2022-11-19 DIAGNOSIS — F1290 Cannabis use, unspecified, uncomplicated: Secondary | ICD-10-CM | POA: Diagnosis not present

## 2022-11-19 DIAGNOSIS — F1120 Opioid dependence, uncomplicated: Secondary | ICD-10-CM | POA: Diagnosis not present

## 2022-12-03 DIAGNOSIS — B349 Viral infection, unspecified: Secondary | ICD-10-CM | POA: Diagnosis not present

## 2022-12-03 DIAGNOSIS — F1120 Opioid dependence, uncomplicated: Secondary | ICD-10-CM | POA: Diagnosis not present

## 2022-12-03 DIAGNOSIS — F419 Anxiety disorder, unspecified: Secondary | ICD-10-CM | POA: Diagnosis not present

## 2022-12-03 DIAGNOSIS — F1290 Cannabis use, unspecified, uncomplicated: Secondary | ICD-10-CM | POA: Diagnosis not present

## 2022-12-05 DIAGNOSIS — F1120 Opioid dependence, uncomplicated: Secondary | ICD-10-CM | POA: Diagnosis not present

## 2022-12-05 DIAGNOSIS — B349 Viral infection, unspecified: Secondary | ICD-10-CM | POA: Diagnosis not present

## 2022-12-05 DIAGNOSIS — F419 Anxiety disorder, unspecified: Secondary | ICD-10-CM | POA: Diagnosis not present

## 2022-12-05 DIAGNOSIS — F1290 Cannabis use, unspecified, uncomplicated: Secondary | ICD-10-CM | POA: Diagnosis not present

## 2022-12-17 DIAGNOSIS — F1120 Opioid dependence, uncomplicated: Secondary | ICD-10-CM | POA: Diagnosis not present

## 2022-12-31 DIAGNOSIS — F1290 Cannabis use, unspecified, uncomplicated: Secondary | ICD-10-CM | POA: Diagnosis not present

## 2022-12-31 DIAGNOSIS — F419 Anxiety disorder, unspecified: Secondary | ICD-10-CM | POA: Diagnosis not present

## 2022-12-31 DIAGNOSIS — F1120 Opioid dependence, uncomplicated: Secondary | ICD-10-CM | POA: Diagnosis not present

## 2022-12-31 DIAGNOSIS — B349 Viral infection, unspecified: Secondary | ICD-10-CM | POA: Diagnosis not present

## 2023-01-14 DIAGNOSIS — F419 Anxiety disorder, unspecified: Secondary | ICD-10-CM | POA: Diagnosis not present

## 2023-01-14 DIAGNOSIS — F1290 Cannabis use, unspecified, uncomplicated: Secondary | ICD-10-CM | POA: Diagnosis not present

## 2023-01-14 DIAGNOSIS — B349 Viral infection, unspecified: Secondary | ICD-10-CM | POA: Diagnosis not present

## 2023-01-14 DIAGNOSIS — F1120 Opioid dependence, uncomplicated: Secondary | ICD-10-CM | POA: Diagnosis not present

## 2023-01-28 DIAGNOSIS — F419 Anxiety disorder, unspecified: Secondary | ICD-10-CM | POA: Diagnosis not present

## 2023-01-28 DIAGNOSIS — B349 Viral infection, unspecified: Secondary | ICD-10-CM | POA: Diagnosis not present

## 2023-01-28 DIAGNOSIS — F1290 Cannabis use, unspecified, uncomplicated: Secondary | ICD-10-CM | POA: Diagnosis not present

## 2023-01-28 DIAGNOSIS — F1120 Opioid dependence, uncomplicated: Secondary | ICD-10-CM | POA: Diagnosis not present

## 2023-02-11 DIAGNOSIS — F1290 Cannabis use, unspecified, uncomplicated: Secondary | ICD-10-CM | POA: Diagnosis not present

## 2023-02-11 DIAGNOSIS — B349 Viral infection, unspecified: Secondary | ICD-10-CM | POA: Diagnosis not present

## 2023-02-11 DIAGNOSIS — F1120 Opioid dependence, uncomplicated: Secondary | ICD-10-CM | POA: Diagnosis not present

## 2023-02-11 DIAGNOSIS — F419 Anxiety disorder, unspecified: Secondary | ICD-10-CM | POA: Diagnosis not present

## 2023-02-11 DIAGNOSIS — F17200 Nicotine dependence, unspecified, uncomplicated: Secondary | ICD-10-CM | POA: Diagnosis not present

## 2023-02-25 DIAGNOSIS — F1290 Cannabis use, unspecified, uncomplicated: Secondary | ICD-10-CM | POA: Diagnosis not present

## 2023-02-25 DIAGNOSIS — F1120 Opioid dependence, uncomplicated: Secondary | ICD-10-CM | POA: Diagnosis not present

## 2023-02-25 DIAGNOSIS — B349 Viral infection, unspecified: Secondary | ICD-10-CM | POA: Diagnosis not present

## 2023-02-25 DIAGNOSIS — F419 Anxiety disorder, unspecified: Secondary | ICD-10-CM | POA: Diagnosis not present

## 2023-03-11 DIAGNOSIS — F1120 Opioid dependence, uncomplicated: Secondary | ICD-10-CM | POA: Diagnosis not present

## 2023-03-11 DIAGNOSIS — B349 Viral infection, unspecified: Secondary | ICD-10-CM | POA: Diagnosis not present

## 2023-03-11 DIAGNOSIS — F419 Anxiety disorder, unspecified: Secondary | ICD-10-CM | POA: Diagnosis not present

## 2023-03-11 DIAGNOSIS — F1290 Cannabis use, unspecified, uncomplicated: Secondary | ICD-10-CM | POA: Diagnosis not present

## 2023-03-25 DIAGNOSIS — F1120 Opioid dependence, uncomplicated: Secondary | ICD-10-CM | POA: Diagnosis not present

## 2023-03-25 DIAGNOSIS — F1290 Cannabis use, unspecified, uncomplicated: Secondary | ICD-10-CM | POA: Diagnosis not present

## 2023-03-25 DIAGNOSIS — B349 Viral infection, unspecified: Secondary | ICD-10-CM | POA: Diagnosis not present

## 2023-03-25 DIAGNOSIS — F419 Anxiety disorder, unspecified: Secondary | ICD-10-CM | POA: Diagnosis not present

## 2023-04-08 DIAGNOSIS — F1120 Opioid dependence, uncomplicated: Secondary | ICD-10-CM | POA: Diagnosis not present

## 2023-04-08 DIAGNOSIS — F1290 Cannabis use, unspecified, uncomplicated: Secondary | ICD-10-CM | POA: Diagnosis not present

## 2023-04-08 DIAGNOSIS — F419 Anxiety disorder, unspecified: Secondary | ICD-10-CM | POA: Diagnosis not present

## 2023-04-08 DIAGNOSIS — B349 Viral infection, unspecified: Secondary | ICD-10-CM | POA: Diagnosis not present

## 2023-04-10 DIAGNOSIS — F419 Anxiety disorder, unspecified: Secondary | ICD-10-CM | POA: Diagnosis not present

## 2023-04-10 DIAGNOSIS — F1120 Opioid dependence, uncomplicated: Secondary | ICD-10-CM | POA: Diagnosis not present

## 2023-04-10 DIAGNOSIS — F1290 Cannabis use, unspecified, uncomplicated: Secondary | ICD-10-CM | POA: Diagnosis not present

## 2023-04-22 DIAGNOSIS — F1290 Cannabis use, unspecified, uncomplicated: Secondary | ICD-10-CM | POA: Diagnosis not present

## 2023-04-22 DIAGNOSIS — F419 Anxiety disorder, unspecified: Secondary | ICD-10-CM | POA: Diagnosis not present

## 2023-04-22 DIAGNOSIS — B349 Viral infection, unspecified: Secondary | ICD-10-CM | POA: Diagnosis not present

## 2023-04-22 DIAGNOSIS — F1120 Opioid dependence, uncomplicated: Secondary | ICD-10-CM | POA: Diagnosis not present

## 2023-05-06 DIAGNOSIS — F1120 Opioid dependence, uncomplicated: Secondary | ICD-10-CM | POA: Diagnosis not present

## 2023-05-07 DIAGNOSIS — F1120 Opioid dependence, uncomplicated: Secondary | ICD-10-CM | POA: Diagnosis not present

## 2023-05-07 DIAGNOSIS — B349 Viral infection, unspecified: Secondary | ICD-10-CM | POA: Diagnosis not present

## 2023-05-07 DIAGNOSIS — F1290 Cannabis use, unspecified, uncomplicated: Secondary | ICD-10-CM | POA: Diagnosis not present

## 2023-05-07 DIAGNOSIS — F419 Anxiety disorder, unspecified: Secondary | ICD-10-CM | POA: Diagnosis not present

## 2023-05-20 DIAGNOSIS — F419 Anxiety disorder, unspecified: Secondary | ICD-10-CM | POA: Diagnosis not present

## 2023-05-20 DIAGNOSIS — B349 Viral infection, unspecified: Secondary | ICD-10-CM | POA: Diagnosis not present

## 2023-05-20 DIAGNOSIS — F1120 Opioid dependence, uncomplicated: Secondary | ICD-10-CM | POA: Diagnosis not present

## 2023-05-20 DIAGNOSIS — F1290 Cannabis use, unspecified, uncomplicated: Secondary | ICD-10-CM | POA: Diagnosis not present

## 2023-06-05 DIAGNOSIS — F1120 Opioid dependence, uncomplicated: Secondary | ICD-10-CM | POA: Diagnosis not present

## 2023-06-05 DIAGNOSIS — F1290 Cannabis use, unspecified, uncomplicated: Secondary | ICD-10-CM | POA: Diagnosis not present

## 2023-06-05 DIAGNOSIS — F17200 Nicotine dependence, unspecified, uncomplicated: Secondary | ICD-10-CM | POA: Diagnosis not present

## 2023-06-05 DIAGNOSIS — F419 Anxiety disorder, unspecified: Secondary | ICD-10-CM | POA: Diagnosis not present

## 2023-06-05 DIAGNOSIS — B349 Viral infection, unspecified: Secondary | ICD-10-CM | POA: Diagnosis not present

## 2023-06-17 DIAGNOSIS — F17200 Nicotine dependence, unspecified, uncomplicated: Secondary | ICD-10-CM | POA: Diagnosis not present

## 2023-06-17 DIAGNOSIS — F419 Anxiety disorder, unspecified: Secondary | ICD-10-CM | POA: Diagnosis not present

## 2023-06-17 DIAGNOSIS — F1120 Opioid dependence, uncomplicated: Secondary | ICD-10-CM | POA: Diagnosis not present

## 2023-06-17 DIAGNOSIS — F1290 Cannabis use, unspecified, uncomplicated: Secondary | ICD-10-CM | POA: Diagnosis not present

## 2023-06-17 DIAGNOSIS — B349 Viral infection, unspecified: Secondary | ICD-10-CM | POA: Diagnosis not present

## 2023-06-18 DIAGNOSIS — F1290 Cannabis use, unspecified, uncomplicated: Secondary | ICD-10-CM | POA: Diagnosis not present

## 2023-06-18 DIAGNOSIS — F1120 Opioid dependence, uncomplicated: Secondary | ICD-10-CM | POA: Diagnosis not present

## 2023-06-18 DIAGNOSIS — F419 Anxiety disorder, unspecified: Secondary | ICD-10-CM | POA: Diagnosis not present

## 2023-07-01 DIAGNOSIS — F17200 Nicotine dependence, unspecified, uncomplicated: Secondary | ICD-10-CM | POA: Diagnosis not present

## 2023-07-01 DIAGNOSIS — F419 Anxiety disorder, unspecified: Secondary | ICD-10-CM | POA: Diagnosis not present

## 2023-07-01 DIAGNOSIS — F1120 Opioid dependence, uncomplicated: Secondary | ICD-10-CM | POA: Diagnosis not present

## 2023-07-01 DIAGNOSIS — F1290 Cannabis use, unspecified, uncomplicated: Secondary | ICD-10-CM | POA: Diagnosis not present

## 2023-07-15 DIAGNOSIS — F1120 Opioid dependence, uncomplicated: Secondary | ICD-10-CM | POA: Diagnosis not present

## 2023-07-16 DIAGNOSIS — F17200 Nicotine dependence, unspecified, uncomplicated: Secondary | ICD-10-CM | POA: Diagnosis not present

## 2023-07-16 DIAGNOSIS — F419 Anxiety disorder, unspecified: Secondary | ICD-10-CM | POA: Diagnosis not present

## 2023-07-16 DIAGNOSIS — F1290 Cannabis use, unspecified, uncomplicated: Secondary | ICD-10-CM | POA: Diagnosis not present

## 2023-07-16 DIAGNOSIS — B349 Viral infection, unspecified: Secondary | ICD-10-CM | POA: Diagnosis not present

## 2023-07-16 DIAGNOSIS — F1120 Opioid dependence, uncomplicated: Secondary | ICD-10-CM | POA: Diagnosis not present

## 2023-07-17 DIAGNOSIS — F17200 Nicotine dependence, unspecified, uncomplicated: Secondary | ICD-10-CM | POA: Diagnosis not present

## 2023-07-17 DIAGNOSIS — B349 Viral infection, unspecified: Secondary | ICD-10-CM | POA: Diagnosis not present

## 2023-07-17 DIAGNOSIS — F1120 Opioid dependence, uncomplicated: Secondary | ICD-10-CM | POA: Diagnosis not present

## 2023-07-17 DIAGNOSIS — F1290 Cannabis use, unspecified, uncomplicated: Secondary | ICD-10-CM | POA: Diagnosis not present

## 2023-07-17 DIAGNOSIS — F419 Anxiety disorder, unspecified: Secondary | ICD-10-CM | POA: Diagnosis not present

## 2023-07-29 DIAGNOSIS — F419 Anxiety disorder, unspecified: Secondary | ICD-10-CM | POA: Diagnosis not present

## 2023-07-29 DIAGNOSIS — F1120 Opioid dependence, uncomplicated: Secondary | ICD-10-CM | POA: Diagnosis not present

## 2023-07-29 DIAGNOSIS — F1290 Cannabis use, unspecified, uncomplicated: Secondary | ICD-10-CM | POA: Diagnosis not present

## 2023-07-29 DIAGNOSIS — B349 Viral infection, unspecified: Secondary | ICD-10-CM | POA: Diagnosis not present

## 2023-08-12 DIAGNOSIS — F1290 Cannabis use, unspecified, uncomplicated: Secondary | ICD-10-CM | POA: Diagnosis not present

## 2023-08-12 DIAGNOSIS — F17200 Nicotine dependence, unspecified, uncomplicated: Secondary | ICD-10-CM | POA: Diagnosis not present

## 2023-08-12 DIAGNOSIS — F419 Anxiety disorder, unspecified: Secondary | ICD-10-CM | POA: Diagnosis not present

## 2023-08-12 DIAGNOSIS — B349 Viral infection, unspecified: Secondary | ICD-10-CM | POA: Diagnosis not present

## 2023-08-12 DIAGNOSIS — F1120 Opioid dependence, uncomplicated: Secondary | ICD-10-CM | POA: Diagnosis not present
